# Patient Record
Sex: Female | Born: 1960 | Race: White | Hispanic: No | Marital: Married | Smoking: Never smoker
Health system: Southern US, Community
[De-identification: ages and names within clinical notes are randomized; demographics above are authoritative.]

## PROBLEM LIST (undated history)

## (undated) DIAGNOSIS — I499 Cardiac arrhythmia, unspecified: Secondary | ICD-10-CM

## (undated) HISTORY — PX: ABDOMINAL HYSTERECTOMY: SHX81

## (undated) HISTORY — PX: FRACTURE SURGERY: SHX138

---

## 2006-06-26 ENCOUNTER — Inpatient Hospital Stay: Payer: Self-pay | Admitting: Unknown Physician Specialty

## 2007-03-26 ENCOUNTER — Ambulatory Visit: Payer: Self-pay | Admitting: General Practice

## 2007-04-23 ENCOUNTER — Ambulatory Visit: Payer: Self-pay | Admitting: General Practice

## 2008-07-17 ENCOUNTER — Emergency Department: Payer: Self-pay | Admitting: Emergency Medicine

## 2008-08-24 ENCOUNTER — Ambulatory Visit: Payer: Self-pay | Admitting: Orthopedic Surgery

## 2008-09-14 ENCOUNTER — Encounter: Payer: Self-pay | Admitting: Orthopedic Surgery

## 2008-10-14 ENCOUNTER — Encounter: Payer: Self-pay | Admitting: Orthopedic Surgery

## 2008-11-14 ENCOUNTER — Encounter: Payer: Self-pay | Admitting: Orthopedic Surgery

## 2010-08-31 ENCOUNTER — Emergency Department: Payer: Self-pay | Admitting: Emergency Medicine

## 2011-03-13 ENCOUNTER — Emergency Department: Payer: Self-pay | Admitting: Emergency Medicine

## 2013-07-13 ENCOUNTER — Ambulatory Visit: Payer: Self-pay | Admitting: Physician Assistant

## 2014-01-19 ENCOUNTER — Ambulatory Visit: Payer: Self-pay | Admitting: Physician Assistant

## 2014-03-25 ENCOUNTER — Ambulatory Visit: Payer: Self-pay | Admitting: Gastroenterology

## 2015-09-20 DIAGNOSIS — R7303 Prediabetes: Secondary | ICD-10-CM | POA: Insufficient documentation

## 2015-09-20 DIAGNOSIS — J4520 Mild intermittent asthma, uncomplicated: Secondary | ICD-10-CM | POA: Insufficient documentation

## 2016-08-24 ENCOUNTER — Emergency Department
Admission: EM | Admit: 2016-08-24 | Discharge: 2016-08-24 | Disposition: A | Discharge status: Home / Self Care | Payer: Worker's Compensation | Attending: Emergency Medicine | Admitting: Emergency Medicine

## 2016-08-24 ENCOUNTER — Emergency Department: Payer: Worker's Compensation

## 2016-08-24 ENCOUNTER — Encounter: Payer: Self-pay | Admitting: Urgent Care

## 2016-08-24 DIAGNOSIS — Y9389 Activity, other specified: Secondary | ICD-10-CM | POA: Diagnosis not present

## 2016-08-24 DIAGNOSIS — Y929 Unspecified place or not applicable: Secondary | ICD-10-CM | POA: Insufficient documentation

## 2016-08-24 DIAGNOSIS — Y990 Civilian activity done for income or pay: Secondary | ICD-10-CM | POA: Insufficient documentation

## 2016-08-24 DIAGNOSIS — W1839XA Other fall on same level, initial encounter: Secondary | ICD-10-CM | POA: Diagnosis not present

## 2016-08-24 DIAGNOSIS — S62102A Fracture of unspecified carpal bone, left wrist, initial encounter for closed fracture: Secondary | ICD-10-CM | POA: Insufficient documentation

## 2016-08-24 DIAGNOSIS — S6992XA Unspecified injury of left wrist, hand and finger(s), initial encounter: Secondary | ICD-10-CM | POA: Diagnosis present

## 2016-08-24 DIAGNOSIS — R52 Pain, unspecified: Secondary | ICD-10-CM

## 2016-08-24 HISTORY — DX: Cardiac arrhythmia, unspecified: I49.9

## 2016-08-24 MED ORDER — OXYCODONE-ACETAMINOPHEN 5-325 MG PO TABS
ORAL_TABLET | ORAL | Status: AC
  Administered 2016-08-24: 1 via ORAL
  Filled 2016-08-24: qty 1

## 2016-08-24 MED ORDER — OXYCODONE-ACETAMINOPHEN 5-325 MG PO TABS
1.0000 | ORAL_TABLET | Freq: Once | ORAL | Status: AC
  Administered 2016-08-24: 1 via ORAL

## 2016-08-24 MED ORDER — OXYCODONE-ACETAMINOPHEN 5-325 MG PO TABS: 1.0000 | ORAL_TABLET | ORAL | 0 refills | Status: DC | PRN
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
## 2017-10-20 DIAGNOSIS — H43813 Vitreous degeneration, bilateral: Secondary | ICD-10-CM | POA: Diagnosis not present

## 2017-10-29 DIAGNOSIS — J209 Acute bronchitis, unspecified: Secondary | ICD-10-CM | POA: Diagnosis not present

## 2017-11-03 DIAGNOSIS — J209 Acute bronchitis, unspecified: Secondary | ICD-10-CM | POA: Diagnosis not present

## 2017-11-03 DIAGNOSIS — J019 Acute sinusitis, unspecified: Secondary | ICD-10-CM | POA: Diagnosis not present

## 2018-09-18 DIAGNOSIS — J209 Acute bronchitis, unspecified: Secondary | ICD-10-CM | POA: Diagnosis not present

## 2018-10-23 ENCOUNTER — Other Ambulatory Visit: Payer: Self-pay | Admitting: Physician Assistant

## 2018-10-23 DIAGNOSIS — Z1231 Encounter for screening mammogram for malignant neoplasm of breast: Secondary | ICD-10-CM

## 2019-10-20 ENCOUNTER — Other Ambulatory Visit: Payer: Self-pay

## 2019-10-20 ENCOUNTER — Ambulatory Visit
Admission: RE | Admit: 2019-10-20 | Discharge: 2019-10-20 | Disposition: A | Discharge status: Home / Self Care | Payer: 59 | Source: Ambulatory Visit | Attending: Physician Assistant | Admitting: Physician Assistant

## 2019-10-20 ENCOUNTER — Other Ambulatory Visit: Payer: Self-pay | Admitting: Physician Assistant

## 2019-10-20 ENCOUNTER — Other Ambulatory Visit (HOSPITAL_COMMUNITY): Payer: Self-pay | Admitting: Physician Assistant

## 2019-10-20 DIAGNOSIS — R600 Localized edema: Secondary | ICD-10-CM

## 2019-11-09 ENCOUNTER — Encounter (INDEPENDENT_AMBULATORY_CARE_PROVIDER_SITE_OTHER): Payer: Self-pay | Admitting: Vascular Surgery

## 2019-11-09 ENCOUNTER — Other Ambulatory Visit: Payer: Self-pay

## 2019-11-09 ENCOUNTER — Ambulatory Visit (INDEPENDENT_AMBULATORY_CARE_PROVIDER_SITE_OTHER): Payer: 59 | Admitting: Vascular Surgery

## 2019-11-09 VITALS — BP 149/81 | HR 68 | Resp 14 | Ht 71.0 in | Wt 375.0 lb

## 2019-11-09 DIAGNOSIS — M7989 Other specified soft tissue disorders: Secondary | ICD-10-CM | POA: Diagnosis not present

## 2019-11-09 DIAGNOSIS — M75120 Complete rotator cuff tear or rupture of unspecified shoulder, not specified as traumatic: Secondary | ICD-10-CM | POA: Insufficient documentation

## 2019-11-09 DIAGNOSIS — Z6841 Body Mass Index (BMI) 40.0 and over, adult: Secondary | ICD-10-CM | POA: Diagnosis not present

## 2019-11-09 DIAGNOSIS — E669 Obesity, unspecified: Secondary | ICD-10-CM | POA: Insufficient documentation

## 2019-11-09 DIAGNOSIS — R7303 Prediabetes: Secondary | ICD-10-CM | POA: Diagnosis not present

## 2019-11-09 DIAGNOSIS — T7840XA Allergy, unspecified, initial encounter: Secondary | ICD-10-CM | POA: Insufficient documentation

## 2019-11-09 DIAGNOSIS — G43909 Migraine, unspecified, not intractable, without status migrainosus: Secondary | ICD-10-CM | POA: Insufficient documentation

## 2019-11-09 NOTE — Assessment & Plan Note (Signed)
Weight loss would be of benefit for lower extremity swelling.

## 2019-11-09 NOTE — Progress Notes (Signed)
Patient ID: Joanna Nielsen, female   DOB: Oct 04, 1961, 59 y.o.   MRN: [id number]  Chief Complaint  Patient presents with  . New Patient (Initial Visit)    RLE Edema    HPI Joanna Nielsen is a 59 y.o. female.  I am asked to see the patient by Alvira Philips for evaluation of leg swelling.  For about 3 months, her right leg swelling has gotten progressively worse.  She has been wearing compression stockings, elevating her legs, but the swelling is worsened.  She was ruled out for DVT.  Her left leg has some mild swelling but not as severe.  She denies any ulceration or infection.  The leg is very heavy and difficult to move around.  Her work involves long periods of standing which is difficult.  Her boss has been letting her elevate her legs.  She denies any open wounds or ulcerations.  No fevers or chills.   Past Medical History:  Diagnosis Date  . Irregular heartbeat     Past Surgical History:  Procedure Laterality Date  . ABDOMINAL HYSTERECTOMY    . FRACTURE SURGERY      Family History No bleeding or clotting disorders No aneurysms No autoimmune diseases.    Social History   Tobacco Use  . Smoking status: Never Smoker  . Smokeless tobacco: Never Used  Substance Use Topics  . Alcohol use: No  . Drug use: Not on file  Works at a hotel   No Known Allergies  Current Outpatient Medications  Medication Sig Dispense Refill  . ALPRAZolam (XANAX) 0.5 MG tablet Take 0.5 mg by mouth 2 (two) times daily as needed.    . doxycycline (VIBRAMYCIN) 100 MG capsule Take 100 mg by mouth 2 (two) times daily.    Marland Kitchen oxyCODONE-acetaminophen (ROXICET) 5-325 MG tablet Take 1 tablet by mouth every 4 (four) hours as needed for severe pain. (Patient not taking: Reported on 11/09/2019) 20 tablet 0   No current facility-administered medications for this visit.      REVIEW OF SYSTEMS (Negative unless checked)  Constitutional: [] Weight loss  [] Fever  [] Chills Cardiac: [] Chest pain   [] Chest  pressure   [x] Palpitations   [] Shortness of breath when laying flat   [] Shortness of breath at rest   [x] Shortness of breath with exertion. Vascular:  [] Pain in legs with walking   [] Pain in legs at rest   [] Pain in legs when laying flat   [] Claudication   [] Pain in feet when walking  [] Pain in feet at rest  [] Pain in feet when laying flat   [] History of DVT   [] Phlebitis   [] Swelling in legs   [] Varicose veins   [] Non-healing ulcers Pulmonary:   [] Uses home oxygen   [] Productive cough   [] Hemoptysis   [] Wheeze  [] COPD   [x] Asthma Neurologic:  [] Dizziness  [] Blackouts   [] Seizures   [] History of stroke   [] History of TIA  [] Aphasia   [] Temporary blindness   [] Dysphagia   [] Weakness or numbness in arms   [] Weakness or numbness in legs Musculoskeletal:  [x] Arthritis   [] Joint swelling   [] Joint pain   [] Low back pain Hematologic:  [] Easy bruising  [] Easy bleeding   [] Hypercoagulable state   [] Anemic  [] Hepatitis Gastrointestinal:  [] Blood in stool   [] Vomiting blood  [] Gastroesophageal reflux/heartburn   [] Abdominal pain Genitourinary:  [] Chronic kidney disease   [] Difficult urination  [] Frequent urination  [] Burning with urination   [] Hematuria Skin:  [] Rashes   [] Ulcers   []   Wounds Psychological:  [] History of anxiety   []  History of major depression.    Physical Exam BP (!) 149/81 (BP Location: Right Arm)   Pulse 68   Resp 14   Ht 5\' 11"  (1.803 m)   Wt (!) 375 lb (170.1 kg)   BMI 52.30 kg/m  Gen:  WD/WN, NAD.  Obese Head: Eek/AT, No temporalis wasting. Ear/Nose/Throat: Hearing grossly intact, nares w/o erythema or drainage, oropharynx w/o Erythema/Exudate Eyes: Conjunctiva clear, sclera non-icteric  Neck: trachea midline.  No JVD.  Pulmonary:  Good air movement, respirations not labored, no use of accessory muscles  Cardiac: RRR, no JVD Vascular:  Vessel Right Left  Radial Palpable Palpable                          DP  not palpable  1+  PT  not palpable  trace    Gastrointestinal:. No masses, surgical incisions, or scars. Musculoskeletal: M/S 5/5 throughout.  Extremities without ischemic changes.  No deformity or atrophy.  3+ right lower extremity edema with skin thickening and moderate stasis changes.  1+ left lower extremity pitting edema. Neurologic: Sensation grossly intact in extremities.  Symmetrical.  Speech is fluent. Motor exam as listed above. Psychiatric: Judgment intact, Mood & affect appropriate for pt's clinical situation. Dermatologic: No rashes or ulcers noted.  No cellulitis or open wounds.    Radiology Venous Img Lower Unilateral Right (DVT)  Result Date: 10/20/2019 CLINICAL DATA:  Intermittent right lower extremity pain and edema for the past year. Evaluate for DVT. EXAM: RIGHT LOWER EXTREMITY VENOUS DOPPLER ULTRASOUND TECHNIQUE: Gray-scale sonography with graded compression, as well as color Doppler and duplex ultrasound were performed to evaluate the lower extremity deep venous systems from the level of the common femoral vein and including the common femoral, femoral, profunda femoral, popliteal and calf veins including the posterior tibial, peroneal and gastrocnemius veins when visible. The superficial great saphenous vein was also interrogated. Spectral Doppler was utilized to evaluate flow at rest and with distal augmentation maneuvers in the common femoral, femoral and popliteal veins. COMPARISON:  None. FINDINGS: Examination is degraded due to patient body habitus and poor sonographic window. Contralateral Common Femoral Vein: Respiratory phasicity is normal and symmetric with the symptomatic side. No evidence of thrombus. Normal compressibility. Common Femoral Vein: No evidence of thrombus. Normal compressibility, respiratory phasicity and response to augmentation. Saphenofemoral Junction: No evidence of thrombus. Normal compressibility and flow on color Doppler imaging. Profunda Femoral Vein: No evidence of thrombus. Normal  compressibility and flow on color Doppler imaging. Femoral Vein: No evidence of thrombus. Normal compressibility, respiratory phasicity and response to augmentation. Popliteal Vein: No evidence of thrombus. Normal compressibility, respiratory phasicity and response to augmentation. Calf Veins: Appear patent where imaged. Superficial Great Saphenous Vein: No evidence of thrombus. Normal compressibility. Venous Reflux:  None. Other Findings: There is a moderate amount of subcutaneous edema the level the right calf (images 37 and 38). IMPRESSION: No evidence of DVT within the right lower extremity. Electronically Signed   By: M.D.   On: 10/20/2019 12:36    Labs No results found for this or any previous visit (from the past 2160 hour(s)).  Assessment/Plan:  Prediabetes blood glucose control important in reducing the progression of atherosclerotic disease. Also, involved in wound healing. On appropriate medications.   Swelling of limb I have had a long discussion with the patient regarding swelling and why it  causes symptoms.  Patient will  begin wearing graduated compression stockings class 1 (20-30 mmHg) on a daily basis a prescription was given. The patient will  beginning wearing the stockings first thing in the morning and removing them in the evening. The patient is instructed specifically not to sleep in the stockings.   In addition, behavioral modification will be initiated.  This will include frequent elevation, use of over the counter pain medications and exercise such as walking.  I have reviewed systemic causes for chronic edema such as liver, kidney and cardiac etiologies.  The patient denies problems with these organ systems.    Consideration for a lymph pump will also be made based upon the effectiveness of conservative therapy.  This would help to improve the edema control and prevent sequela such as ulcers and infections   Patient should undergo duplex ultrasound of the  venous system to ensure that DVT or reflux is not present.  The patient will follow-up with me after the ultrasound.    Obesity Weight loss would be of benefit for lower extremity swelling.      Festus Barren 11/09/2019, 11:06 AM   This note was created with Dragon medical transcription system.  Any errors from dictation are unintentional.

## 2019-11-09 NOTE — Assessment & Plan Note (Signed)

## 2019-11-09 NOTE — Assessment & Plan Note (Signed)
blood glucose control important in reducing the progression of atherosclerotic disease. Also, involved in wound healing. On appropriate medications.  

## 2019-11-09 NOTE — Patient Instructions (Signed)
Edema  Edema is when you have too much fluid in your body or under your skin. Edema may make your legs, feet, and ankles swell up. Swelling is also common in looser tissues, like around your eyes. This is a common condition. It gets more common as you get older. There are many possible causes of edema. Eating too much salt (sodium) and being on your feet or sitting for a long time can cause edema in your legs, feet, and ankles. Hot weather may make edema worse. Edema is usually painless. Your skin may look swollen or shiny. Follow these instructions at home:  Keep the swollen body part raised (elevated) above the level of your heart when you are sitting or lying down.  Do not sit still or stand for a long time.  Do not wear tight clothes. Do not wear garters on your upper legs.  Exercise your legs. This can help the swelling go down.  Wear elastic bandages or support stockings as told by your doctor.  Eat a low-salt (low-sodium) diet to reduce fluid as told by your doctor.  Depending on the cause of your swelling, you may need to limit how much fluid you drink (fluid restriction).  Take over-the-counter and prescription medicines only as told by your doctor. Contact a doctor if:  Treatment is not working.  You have heart, liver, or kidney disease and have symptoms of edema.  You have sudden and unexplained weight gain. Get help right away if:  You have shortness of breath or chest pain.  You cannot breathe when you lie down.  You have pain, redness, or warmth in the swollen areas.  You have heart, liver, or kidney disease and get edema all of a sudden.  You have a fever and your symptoms get worse all of a sudden. Summary  Edema is when you have too much fluid in your body or under your skin.  Edema may make your legs, feet, and ankles swell up. Swelling is also common in looser tissues, like around your eyes.  Raise (elevate) the swollen body part above the level of your  heart when you are sitting or lying down.  Follow your doctor's instructions about diet and how much fluid you can drink (fluid restriction). This information is not intended to replace advice given to you by your health care provider. Make sure you discuss any questions you have with your health care provider. Document Revised: 10/03/2017 Document Reviewed: 10/18/2016 Elsevier Patient Education  2020 [employer]  

## 2019-11-24 ENCOUNTER — Ambulatory Visit (INDEPENDENT_AMBULATORY_CARE_PROVIDER_SITE_OTHER): Payer: 59 | Admitting: Nurse Practitioner

## 2019-11-24 ENCOUNTER — Ambulatory Visit (INDEPENDENT_AMBULATORY_CARE_PROVIDER_SITE_OTHER): Payer: 59

## 2019-11-24 ENCOUNTER — Encounter (INDEPENDENT_AMBULATORY_CARE_PROVIDER_SITE_OTHER): Payer: Self-pay | Admitting: Nurse Practitioner

## 2019-11-24 ENCOUNTER — Other Ambulatory Visit: Payer: Self-pay

## 2019-11-24 VITALS — BP 147/88 | HR 66 | Resp 12 | Ht 71.0 in | Wt 373.0 lb

## 2019-11-24 DIAGNOSIS — I890 Lymphedema, not elsewhere classified: Secondary | ICD-10-CM | POA: Diagnosis not present

## 2019-11-24 DIAGNOSIS — M7989 Other specified soft tissue disorders: Secondary | ICD-10-CM | POA: Diagnosis not present

## 2019-11-24 DIAGNOSIS — J4520 Mild intermittent asthma, uncomplicated: Secondary | ICD-10-CM | POA: Diagnosis not present

## 2019-11-29 ENCOUNTER — Encounter (INDEPENDENT_AMBULATORY_CARE_PROVIDER_SITE_OTHER): Payer: Self-pay | Admitting: Nurse Practitioner

## 2019-11-29 NOTE — Progress Notes (Signed)
SUBJECTIVE:  Patient ID: Joanna Nielsen, female    DOB: 1961-01-19, 59 y.o.   MRN: [id number] Chief Complaint  Patient presents with  . Follow-up    U/S follow up    HPI  Joanna Nielsen is a 59 y.o. female that returns today for noninvasive studies related to her lower extremity edema.  The patient has already been contacted by the lymphedema pump company and she has been fitted and should be receiving it soon.  The patient continues to struggle with utilizing medical grade 1 compression stockings due to the swelling.  She does elevate her lower extremities as much as possible and she also exercises as much as possible.  She denies any fever, chills, nausea, vomiting or diarrhea.  She denies any chest pain or shortness of breath.  Today the patient also underwent noninvasive studies which showed no evidence of DVT or superficial venous thrombosis in the right lower extremity.  There is no evidence of venous reflux seen in the right lower extremity.  Past Medical History:  Diagnosis Date  . Irregular heartbeat     Past Surgical History:  Procedure Laterality Date  . ABDOMINAL HYSTERECTOMY    . FRACTURE SURGERY      Social History   Socioeconomic History  . Marital status: Married    Spouse name: Not on file  . Number of children: Not on file  . Years of education: Not on file  . Highest education level: Not on file  Occupational History  . Not on file  Tobacco Use  . Smoking status: Never Smoker  . Smokeless tobacco: Never Used  Substance and Sexual Activity  . Alcohol use: No  . Drug use: Not on file  . Sexual activity: Not on file  Other Topics Concern  . Not on file  Social History Narrative  . Not on file   Social Determinants of Health   Financial Resource Strain:   . Difficulty of Paying Living Expenses: Not on file  Food Insecurity:   . Worried About Programme researcher, broadcasting/film/video in the Last Year: Not on file  . Ran Out of Food in the Last Year: Not on file    Transportation Needs:   . Lack of Transportation (Medical): Not on file  . Lack of Transportation (Non-Medical): Not on file  Physical Activity:   . Days of Exercise per Week: Not on file  . Minutes of Exercise per Session: Not on file  Stress:   . Feeling of Stress : Not on file  Social Connections:   . Frequency of Communication with Friends and Family: Not on file  . Frequency of Social Gatherings with Friends and Family: Not on file  . Attends Religious Services: Not on file  . Active Member of Clubs or Organizations: Not on file  . Attends Banker Meetings: Not on file  . Marital Status: Not on file  Intimate Partner Violence:   . Fear of Current or Ex-Partner: Not on file  . Emotionally Abused: Not on file  . Physically Abused: Not on file  . Sexually Abused: Not on file    History reviewed. No pertinent family history.  No Known Allergies   Review of Systems   Review of Systems: Negative Unless Checked Constitutional: [] Weight loss  [] Fever  [] Chills Cardiac: [] Chest pain   []  Atrial Fibrillation  [] Palpitations   [] Shortness of breath when laying flat   [] Shortness of breath with exertion. [] Shortness of breath at rest Vascular:  []   Pain in legs with walking   [] Pain in legs with standing [] Pain in legs when laying flat   [] Claudication    [] Pain in feet when laying flat    [] History of DVT   [] Phlebitis   [x] Swelling in legs   [] Varicose veins   [] Non-healing ulcers Pulmonary:   [] Uses home oxygen   [] Productive cough   [] Hemoptysis   [] Wheeze  [] COPD   [x] Asthma Neurologic:  [] Dizziness   [] Seizures  [] Blackouts [] History of stroke   [] History of TIA  [] Aphasia   [] Temporary Blindness   [] Weakness or numbness in arm   [] Weakness or numbness in leg Musculoskeletal:   [] Joint swelling   [] Joint pain   [] Low back pain  []  History of Knee Replacement [] Arthritis [] back Surgeries  []  Spinal Stenosis    Hematologic:  [] Easy bruising  [] Easy bleeding    [] Hypercoagulable state   [] Anemic Gastrointestinal:  [] Diarrhea   [] Vomiting  [] Gastroesophageal reflux/heartburn   [] Difficulty swallowing. [] Abdominal pain Genitourinary:  [] Chronic kidney disease   [] Difficult urination  [] Anuric   [] Blood in urine [] Frequent urination  [] Burning with urination   [] Hematuria Skin:  [x] Rashes   [] Ulcers [] Wounds Psychological:  [] History of anxiety   []  History of major depression  []  Memory Difficulties      OBJECTIVE:   Physical Exam  BP (!) 147/88 (BP Location: Right Wrist)   Pulse 66   Resp 12   Ht 5\' 11"  (1.803 m)   Wt (!) 373 lb (169.2 kg)   BMI 52.02 kg/m   Gen: WD/WN, NAD Head: [place]/AT, No temporalis wasting.  Ear/Nose/Throat: Hearing grossly intact, nares w/o erythema or drainage Eyes: PER, EOMI, sclera nonicteric.  Neck: Supple, no masses.  No JVD.  Pulmonary:  Good air movement, no use of accessory muscles.  Cardiac: RRR Vascular:  3+ edema bilaterally Vessel Right Left  Radial Palpable Palpable  Dorsalis Pedis Palpable Palpable  Posterior Tibial Palpable Palpable   Gastrointestinal: soft, non-distended. No guarding/no peritoneal signs.  Musculoskeletal: M/S 5/5 throughout.  No deformity or atrophy.  Neurologic: Pain and light touch intact in extremities.  Symmetrical.  Speech is fluent. Motor exam as listed above. Psychiatric: Judgment intact, Mood & affect appropriate for pt's clinical situation. Dermatologic:  Stasis dermatitis bilaterally no Ulcers Noted.  No changes consistent with cellulitis. Lymph : No Cervical lymphadenopathy, no lichenification or skin changes of chronic lymphedema.       ASSESSMENT AND PLAN:  1. Lymphedema The patient has been fitted for her pump and she should be receiving it soon.  In order to help with edema control until she is able to receive and use the pump we will place the patient in right lower extremity Unna wraps today.  Patient is instructed that these should stay in place for 7 days and  should not be wet.  Patient will present to the office for weekly Unna wrap changes.  We will reevaluate the patient's lower extremity edema in 4 weeks.  She is advised to continue with elevation and exercise as well.  2. Asthma, stable, mild intermittent Continue pulmonary medications and aerosols as already ordered, these medications have been reviewed and there are no changes at this time.     Current Outpatient Medications on File Prior to Visit  Medication Sig Dispense Refill  . ALPRAZolam (XANAX) 0.5 MG tablet Take 0.5 mg by mouth 2 (two) times daily as needed.    . doxycycline (VIBRAMYCIN) 100 MG capsule Take 100 mg by mouth 2 (two) times  daily.    . oxyCODONE-acetaminophen (ROXICET) 5-325 MG tablet Take 1 tablet by mouth every 4 (four) hours as needed for severe pain. (Patient not taking: Reported on 11/09/2019) 20 tablet 0   No current facility-administered medications on file prior to visit.    There are no Patient Instructions on file for this visit. No follow-ups on file.   [name], NP  This note was completed with Office manager.  Any errors are purely unintentional.

## 2019-12-01 ENCOUNTER — Other Ambulatory Visit: Payer: Self-pay

## 2019-12-01 ENCOUNTER — Ambulatory Visit (INDEPENDENT_AMBULATORY_CARE_PROVIDER_SITE_OTHER): Payer: 59 | Admitting: Nurse Practitioner

## 2019-12-01 ENCOUNTER — Encounter (INDEPENDENT_AMBULATORY_CARE_PROVIDER_SITE_OTHER): Payer: Self-pay

## 2019-12-01 VITALS — BP 144/80 | HR 80 | Resp 16 | Wt 368.0 lb

## 2019-12-01 DIAGNOSIS — I890 Lymphedema, not elsewhere classified: Secondary | ICD-10-CM

## 2019-12-01 NOTE — Progress Notes (Signed)
History of Present Illness  There is no documented history at this time  Assessments & Plan   There are no diagnoses linked to this encounter.    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    Procedure:  3 layer unna wrap was placed Right lower extremity.   Plan:   Follow up in one week.   

## 2019-12-06 ENCOUNTER — Other Ambulatory Visit (INDEPENDENT_AMBULATORY_CARE_PROVIDER_SITE_OTHER): Payer: Self-pay | Admitting: Nurse Practitioner

## 2019-12-08 ENCOUNTER — Other Ambulatory Visit: Payer: Self-pay

## 2019-12-08 ENCOUNTER — Telehealth (INDEPENDENT_AMBULATORY_CARE_PROVIDER_SITE_OTHER): Payer: Self-pay

## 2019-12-08 ENCOUNTER — Ambulatory Visit (INDEPENDENT_AMBULATORY_CARE_PROVIDER_SITE_OTHER): Payer: 59 | Admitting: Nurse Practitioner

## 2019-12-08 VITALS — BP 136/83 | HR 73 | Resp 20 | Ht 71.0 in | Wt 370.0 lb

## 2019-12-08 DIAGNOSIS — I890 Lymphedema, not elsewhere classified: Secondary | ICD-10-CM | POA: Diagnosis not present

## 2019-12-08 NOTE — Telephone Encounter (Signed)
I called and made pt aware what NP Vivia Birmingham said in reference to her lymph-edema pumps, " Let me look into it. We can add it but what may need to happen is that the last person that saw her may need to addend their note.  "

## 2019-12-08 NOTE — Progress Notes (Signed)
History of Present Illness  There is no documented history at this time  Assessments & Plan   There are no diagnoses linked to this encounter.    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    Procedure:  3 layer unna wrap was placed Right lower extremity.   Plan:   Follow up in one week.   

## 2019-12-13 ENCOUNTER — Encounter (INDEPENDENT_AMBULATORY_CARE_PROVIDER_SITE_OTHER): Payer: Self-pay | Admitting: Nurse Practitioner

## 2019-12-15 ENCOUNTER — Other Ambulatory Visit: Payer: Self-pay

## 2019-12-15 ENCOUNTER — Ambulatory Visit (INDEPENDENT_AMBULATORY_CARE_PROVIDER_SITE_OTHER): Payer: 59 | Admitting: Nurse Practitioner

## 2019-12-15 VITALS — BP 130/80 | HR 78 | Resp 16 | Ht 71.0 in | Wt 374.0 lb

## 2019-12-15 DIAGNOSIS — I890 Lymphedema, not elsewhere classified: Secondary | ICD-10-CM

## 2019-12-15 NOTE — Progress Notes (Signed)
History of Present Illness  There is no documented history at this time  Assessments & Plan   There are no diagnoses linked to this encounter.    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    Procedure:  3 layer unna wrap was placed Right lower extremity.   Plan:   Follow up in one week.   

## 2019-12-16 ENCOUNTER — Encounter (INDEPENDENT_AMBULATORY_CARE_PROVIDER_SITE_OTHER): Payer: Self-pay | Admitting: Nurse Practitioner

## 2019-12-22 ENCOUNTER — Encounter (INDEPENDENT_AMBULATORY_CARE_PROVIDER_SITE_OTHER): Payer: Self-pay | Admitting: Nurse Practitioner

## 2019-12-22 ENCOUNTER — Other Ambulatory Visit: Payer: Self-pay

## 2019-12-22 ENCOUNTER — Ambulatory Visit (INDEPENDENT_AMBULATORY_CARE_PROVIDER_SITE_OTHER): Payer: 59 | Admitting: Nurse Practitioner

## 2019-12-22 VITALS — BP 135/84 | HR 74 | Ht 71.0 in | Wt 375.0 lb

## 2019-12-22 DIAGNOSIS — I890 Lymphedema, not elsewhere classified: Secondary | ICD-10-CM | POA: Diagnosis not present

## 2019-12-22 DIAGNOSIS — Z6841 Body Mass Index (BMI) 40.0 and over, adult: Secondary | ICD-10-CM

## 2019-12-27 ENCOUNTER — Encounter (INDEPENDENT_AMBULATORY_CARE_PROVIDER_SITE_OTHER): Payer: Self-pay | Admitting: Nurse Practitioner

## 2019-12-27 NOTE — Progress Notes (Signed)
SUBJECTIVE:  Patient ID: Joanna Nielsen, female    DOB: [birth year], 60 y.o.   MRN: [id number] Chief Complaint  Patient presents with  . Follow-up    R unna check    HPI  Joanna Nielsen is a 59 y.o. female presents today for follow-up of her right lower extremity intermittent.  The patient states that she is feeling better with a wrap on her lower extremity.  It has been doing well with helping with her swelling.  However, the swelling is not completely resolved.  She denies no issues with the wrap.  She denies any fever, chills, nausea, vomiting or diarrhea.  Overall the patient is doing well.  Past Medical History:  Diagnosis Date  . Irregular heartbeat     Past Surgical History:  Procedure Laterality Date  . ABDOMINAL HYSTERECTOMY    . FRACTURE SURGERY      Social History   Socioeconomic History  . Marital status: Married    Spouse name: Not on file  . Number of children: Not on file  . Years of education: Not on file  . Highest education level: Not on file  Occupational History  . Not on file  Tobacco Use  . Smoking status: Never Smoker  . Smokeless tobacco: Never Used  Substance and Sexual Activity  . Alcohol use: No  . Drug use: Not on file  . Sexual activity: Not on file  Other Topics Concern  . Not on file  Social History Narrative  . Not on file   Social Determinants of Health   Financial Resource Strain:   . Difficulty of Paying Living Expenses:   Food Insecurity:   . Worried About Programme researcher, broadcasting/film/video in the Last Year:   . Barista in the Last Year:   Transportation Needs:   . Freight forwarder (Medical):   Marland Kitchen Lack of Transportation (Non-Medical):   Physical Activity:   . Days of Exercise per Week:   . Minutes of Exercise per Session:   Stress:   . Feeling of Stress :   Social Connections:   . Frequency of Communication with Friends and Family:   . Frequency of Social Gatherings with Friends and Family:   . Attends Religious  Services:   . Active Member of Clubs or Organizations:   . Attends Banker Meetings:   Marland Kitchen Marital Status:   Intimate Partner Violence:   . Fear of Current or Ex-Partner:   . Emotionally Abused:   Marland Kitchen Physically Abused:   . Sexually Abused:     History reviewed. No pertinent family history.  No Known Allergies   Review of Systems   Review of Systems: Negative Unless Checked Constitutional: [] Weight loss  [] Fever  [] Chills Cardiac: [] Chest pain   []  Atrial Fibrillation  [] Palpitations   [] Shortness of breath when laying flat   [] Shortness of breath with exertion. [] Shortness of breath at rest Vascular:  [] Pain in legs with walking   [] Pain in legs with standing [] Pain in legs when laying flat   [] Claudication    [] Pain in feet when laying flat    [] History of DVT   [] Phlebitis   [x] Swelling in legs   [] Varicose veins   [] Non-healing ulcers Pulmonary:   [] Uses home oxygen   [] Productive cough   [] Hemoptysis   [] Wheeze  [] COPD   [x] Asthma Neurologic:  [] Dizziness   [] Seizures  [] Blackouts [] History of stroke   [] History of TIA  [] Aphasia   [] Temporary  Blindness   [] Weakness or numbness in arm   [] Weakness or numbness in leg Musculoskeletal:   [] Joint swelling   [] Joint pain   [] Low back pain  []  History of Knee Replacement [] Arthritis [] back Surgeries  []  Spinal Stenosis    Hematologic:  [] Easy bruising  [] Easy bleeding   [] Hypercoagulable state   [] Anemic Gastrointestinal:  [] Diarrhea   [] Vomiting  [] Gastroesophageal reflux/heartburn   [] Difficulty swallowing. [] Abdominal pain Genitourinary:  [] Chronic kidney disease   [] Difficult urination  [] Anuric   [] Blood in urine [] Frequent urination  [] Burning with urination   [] Hematuria Skin:  [x] Rashes   [] Ulcers [] Wounds Psychological:  [] History of anxiety   []  History of major depression  []  Memory Difficulties      OBJECTIVE:   Physical Exam  BP 135/84   Pulse 74   Ht 5\' 11"  (1.803 m)   Wt (!) 375 lb (170.1 kg)   BMI 52.30  kg/m   Gen: WD/WN, NAD Head: [place]/AT, No temporalis wasting.  Ear/Nose/Throat: Hearing grossly intact, nares w/o erythema or drainage Eyes: PER, EOMI, sclera nonicteric.  Neck: Supple, no masses.  No JVD.  Pulmonary:  Good air movement, no use of accessory muscles.  Cardiac: RRR Vascular:  2+ edema left lower extremity 3+ on right Vessel Right Left  Radial Palpable Palpable  Brachial Palpable Palpable  Femoral Palpable Palpable  Popliteal Palpable Palpable  Dorsalis Pedis Palpable Palpable  Posterior Tibial Palpable Palpable   Gastrointestinal: soft, non-distended. No guarding/no peritoneal signs.  Musculoskeletal: M/S 5/5 throughout.  No deformity or atrophy.  Neurologic: Pain and light touch intact in extremities.  Symmetrical.  Speech is fluent. Motor exam as listed above. Psychiatric: Judgment intact, Mood & affect appropriate for pt's clinical situation. Dermatologic:  Bilateral stasis dermatitis.  No Ulcers Noted.  No changes consistent with cellulitis. Lymph : No Cervical lymphadenopathy, bilateral dermal thickening       ASSESSMENT AND PLAN:  1. Lymphedema Recommend:  No surgery or intervention at this point in time.    I have reviewed my previous discussion with the patient regarding swelling and why it causes symptoms.  Patient will continue wearing graduated compression stockings class 1 (20-30 mmHg) on a daily basis. The patient will  beginning wearing the stockings first thing in the morning and removing them in the evening. The patient is instructed specifically not to sleep in the stockings.    In addition, behavioral modification including several periods of elevation of the lower extremities during the day will be continued.  This was reviewed with the patient during the initial visit.  The patient will also continue routine exercise, especially walking on a daily basis as was discussed during the initial visit.    Despite conservative treatments including  graduated compression therapy class 1 and behavioral modification including exercise and elevation the patient  has not obtained adequate control of the lymphedema.  The patient still has stage 3 lymphedema and therefore, I believe that a lymph pump should be added to improve the control of the patient's lymphedema.  Additionally, a lymph pump is warranted because it will reduce the risk of cellulitis and ulceration in the future.  Patient should follow-up in six months    2. Class 3 severe obesity with body mass index (BMI) of 50.0 to 59.9 in adult, unspecified obesity type, unspecified whether serious comorbidity present (HCC) Obesity exacerbates the lower extremity edema.  Patient is instructed to exercise daily as part of her conservative therapy.  Patient has been doing more exercise,  continued behavior is encouraged.   Current Outpatient Medications on File Prior to Visit  Medication Sig Dispense Refill  . ALPRAZolam (XANAX) 0.5 MG tablet Take 0.5 mg by mouth 2 (two) times daily as needed.    . doxycycline (VIBRAMYCIN) 100 MG capsule Take 100 mg by mouth 2 (two) times daily.    Marland Kitchen oxyCODONE-acetaminophen (ROXICET) 5-325 MG tablet Take 1 tablet by mouth every 4 (four) hours as needed for severe pain. (Patient not taking: Reported on 12/01/2019) 20 tablet 0   No current facility-administered medications on file prior to visit.    There are no Patient Instructions on file for this visit. No follow-ups on file.   Kris Hartmann, NP  This note was completed with Sales executive.  Any errors are purely unintentional.

## 2019-12-29 ENCOUNTER — Other Ambulatory Visit: Payer: Self-pay

## 2019-12-29 ENCOUNTER — Ambulatory Visit (INDEPENDENT_AMBULATORY_CARE_PROVIDER_SITE_OTHER): Payer: 59 | Admitting: Nurse Practitioner

## 2019-12-29 VITALS — BP 150/86 | HR 76 | Resp 17 | Ht 71.0 in | Wt 374.0 lb

## 2019-12-29 DIAGNOSIS — I890 Lymphedema, not elsewhere classified: Secondary | ICD-10-CM | POA: Diagnosis not present

## 2019-12-29 NOTE — Progress Notes (Signed)
History of Present Illness  There is no documented history at this time  Assessments & Plan   There are no diagnoses linked to this encounter.    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    Procedure:  3 layer unna wrap was placed Right lower extremity.   Plan:   Follow up in one week.   

## 2019-12-31 ENCOUNTER — Encounter (INDEPENDENT_AMBULATORY_CARE_PROVIDER_SITE_OTHER): Payer: Self-pay | Admitting: Nurse Practitioner

## 2020-01-05 ENCOUNTER — Other Ambulatory Visit: Payer: Self-pay

## 2020-01-05 ENCOUNTER — Encounter (INDEPENDENT_AMBULATORY_CARE_PROVIDER_SITE_OTHER): Payer: Self-pay

## 2020-01-05 ENCOUNTER — Ambulatory Visit (INDEPENDENT_AMBULATORY_CARE_PROVIDER_SITE_OTHER): Payer: 59 | Admitting: Nurse Practitioner

## 2020-01-05 VITALS — BP 128/80 | HR 79 | Resp 16 | Wt 371.6 lb

## 2020-01-05 DIAGNOSIS — I890 Lymphedema, not elsewhere classified: Secondary | ICD-10-CM

## 2020-01-05 NOTE — Progress Notes (Signed)
History of Present Illness  There is no documented history at this time  Assessments & Plan   There are no diagnoses linked to this encounter.    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    Procedure:  3 layer unna wrap was placed Right lower extremity.   Plan:   Follow up in one week.   

## 2020-01-12 ENCOUNTER — Ambulatory Visit (INDEPENDENT_AMBULATORY_CARE_PROVIDER_SITE_OTHER): Payer: 59 | Admitting: Nurse Practitioner

## 2020-01-12 ENCOUNTER — Encounter (INDEPENDENT_AMBULATORY_CARE_PROVIDER_SITE_OTHER): Payer: Self-pay | Admitting: Nurse Practitioner

## 2020-01-12 ENCOUNTER — Other Ambulatory Visit: Payer: Self-pay

## 2020-01-12 VITALS — BP 149/84 | HR 65 | Resp 16 | Wt 370.0 lb

## 2020-01-12 DIAGNOSIS — I890 Lymphedema, not elsewhere classified: Secondary | ICD-10-CM

## 2020-01-12 NOTE — Progress Notes (Signed)
History of Present Illness  There is no documented history at this time  Assessments & Plan   There are no diagnoses linked to this encounter.    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    Procedure:  3 layer unna wrap was placed Right lower extremity.   Plan:   Follow up in one week.   

## 2020-01-19 ENCOUNTER — Encounter (INDEPENDENT_AMBULATORY_CARE_PROVIDER_SITE_OTHER): Payer: Self-pay | Admitting: Nurse Practitioner

## 2020-01-19 ENCOUNTER — Other Ambulatory Visit: Payer: Self-pay

## 2020-01-19 ENCOUNTER — Ambulatory Visit (INDEPENDENT_AMBULATORY_CARE_PROVIDER_SITE_OTHER): Payer: 59 | Admitting: Nurse Practitioner

## 2020-01-19 VITALS — BP 153/83 | HR 80 | Ht 71.0 in | Wt 371.0 lb

## 2020-01-19 DIAGNOSIS — I890 Lymphedema, not elsewhere classified: Secondary | ICD-10-CM

## 2020-01-19 DIAGNOSIS — J4520 Mild intermittent asthma, uncomplicated: Secondary | ICD-10-CM | POA: Diagnosis not present

## 2020-01-19 NOTE — Progress Notes (Signed)
Subjective:    Patient ID: Joanna Nielsen, female    DOB: 03/04/1961, 59 y.o.   MRN: [id number] Chief Complaint  Patient presents with  . Follow-up    Unna boot    The patient returns to the office for followup evaluation regarding leg swelling.  The patient was in [place] wraps with the right lower extremity to help with swelling.  Today the swelling is greatly improved.  The swelling has persisted but with the lymph pump is much, much better controlled. The pain associated with swelling is essentially eliminated. There have not been any interval development of a ulcerations or wounds.  The patient denies problems with the pump, noting it is working well and the leggings are in good condition.  Since the previous visit the patient has been using the lymph pump on a routine basis and  has noted significant improvement in the lymphedema.   Patient stated the lymph pump has been a very positive factor in her care.    Review of Systems  Cardiovascular: Positive for leg swelling.  All other systems reviewed and are negative.      Objective:   Physical Exam Vitals reviewed.  HENT:     Head: Normocephalic.  Cardiovascular:     Rate and Rhythm: Normal rate and regular rhythm.     Pulses: Normal pulses.  Musculoskeletal:     Right lower leg: 1+ Edema present.  Neurological:     Mental Status: She is alert and oriented to person, place, and time.  Psychiatric:        Mood and Affect: Mood normal.        Behavior: Behavior normal.        Thought Content: Thought content normal.        Judgment: Judgment normal.     BP (!) 153/83   Pulse 80   Ht 5\' 11"  (1.803 m)   Wt (!) 371 lb (168.3 kg)   BMI 51.74 kg/m   Past Medical History:  Diagnosis Date  . Irregular heartbeat     Social History   Socioeconomic History  . Marital status: Married    Spouse name: Not on file  . Number of children: Not on file  . Years of education: Not on file  . Highest education level: Not  on file  Occupational History  . Not on file  Tobacco Use  . Smoking status: Never Smoker  . Smokeless tobacco: Never Used  Substance and Sexual Activity  . Alcohol use: No  . Drug use: Not on file  . Sexual activity: Not on file  Other Topics Concern  . Not on file  Social History Narrative  . Not on file   Social Determinants of Health   Financial Resource Strain:   . Difficulty of Paying Living Expenses:   Food Insecurity:   . Worried About Charity fundraiser in the Last Year:   . Arboriculturist in the Last Year:   Transportation Needs:   . Film/video editor (Medical):   Marland Kitchen Lack of Transportation (Non-Medical):   Physical Activity:   . Days of Exercise per Week:   . Minutes of Exercise per Session:   Stress:   . Feeling of Stress :   Social Connections:   . Frequency of Communication with Friends and Family:   . Frequency of Social Gatherings with Friends and Family:   . Attends Religious Services:   . Active Member of Clubs or Organizations:   .  Attends Banker Meetings:   Marland Kitchen Marital Status:   Intimate Partner Violence:   . Fear of Current or Ex-Partner:   . Emotionally Abused:   Marland Kitchen Physically Abused:   . Sexually Abused:     Past Surgical History:  Procedure Laterality Date  . ABDOMINAL HYSTERECTOMY    . FRACTURE SURGERY      History reviewed. No pertinent family history.  No Known Allergies     Assessment & Plan:   1. Lymphedema  No surgery or intervention at this point in time.    I have reviewed my discussion with the patient regarding lymphedema and why it  causes symptoms.  Patient will continue wearing graduated compression stockings class 1 (20-30 mmHg) on a daily basis a prescription was given. The patient is reminded to put the stockings on first thing in the morning and removing them in the evening. The patient is instructed specifically not to sleep in the stockings.   In addition, behavioral modification throughout the  day will be continued.  This will include frequent elevation (such as in a recliner), use of over the counter pain medications as needed and exercise such as walking.  I have reviewed systemic causes for chronic edema such as liver, kidney and cardiac etiologies and there does not appear to be any significant changes in these organ systems over the past year.  The patient is under the impression that these organ systems are all stable and unchanged.    The patient will continue aggressive use of the  lymph pump.  This will continue to improve the edema control and prevent sequela such as ulcers and infections.   Since the patient was recently removed from Unna wraps we will have the patient follow-up in 3 months to evaluate progression with lymphedema pump.   2. Asthma, stable, mild intermittent Continue pulmonary medications and aerosols as already ordered, these medications have been reviewed and there are no changes at this time.     Current Outpatient Medications on File Prior to Visit  Medication Sig Dispense Refill  . ALPRAZolam (XANAX) 0.5 MG tablet Take 0.5 mg by mouth 2 (two) times daily as needed.    . doxycycline (VIBRAMYCIN) 100 MG capsule Take 100 mg by mouth 2 (two) times daily.    Marland Kitchen oxyCODONE-acetaminophen (ROXICET) 5-325 MG tablet Take 1 tablet by mouth every 4 (four) hours as needed for severe pain. (Patient not taking: Reported on 01/12/2020) 20 tablet 0   No current facility-administered medications on file prior to visit.    There are no Patient Instructions on file for this visit. No follow-ups on file.   [name], NP

## 2020-04-19 ENCOUNTER — Ambulatory Visit (INDEPENDENT_AMBULATORY_CARE_PROVIDER_SITE_OTHER): Payer: 59 | Admitting: Nurse Practitioner

## 2020-04-19 ENCOUNTER — Other Ambulatory Visit: Payer: Self-pay

## 2020-04-19 ENCOUNTER — Encounter (INDEPENDENT_AMBULATORY_CARE_PROVIDER_SITE_OTHER): Payer: Self-pay | Admitting: Nurse Practitioner

## 2020-04-19 VITALS — BP 136/80 | HR 84 | Resp 16 | Wt 372.0 lb

## 2020-04-19 DIAGNOSIS — I890 Lymphedema, not elsewhere classified: Secondary | ICD-10-CM | POA: Diagnosis not present

## 2020-04-19 NOTE — Progress Notes (Signed)
Subjective:    Patient ID: Joanna Nielsen, female    DOB: 03/12/1961, 59 y.o.   MRN: [id number] Chief Complaint  Patient presents with  . Follow-up    17month follow up    The patient returns to the office for followup evaluation regarding leg swelling.  The swelling has persisted but with the lymph pump is much, much better controlled. The pain associated with swelling is essentially eliminated. There have not been any interval development of a ulcerations or wounds.  The patient denies problems with the pump, noting it is working well and the leggings are in good condition.  Since the previous visit the patient has been wearing graduated compression stockings and using the lymph pump on a routine basis and  has noted significant improvement in the lymphedema.   Patient stated the lymph pump has been a very positive factor in her care.    Review of Systems  Cardiovascular: Positive for leg swelling.       Objective:   Physical Exam Vitals reviewed.  HENT:     Head: Normocephalic.  Cardiovascular:     Rate and Rhythm: Normal rate and regular rhythm.     Pulses: Normal pulses.     Heart sounds: Normal heart sounds.  Pulmonary:     Effort: Pulmonary effort is normal.     Breath sounds: Normal breath sounds.  Musculoskeletal:     Right lower leg: 1+ Edema present.     Left lower leg: 1+ Edema present.  Skin:    General: Skin is warm and dry.  Neurological:     Mental Status: She is alert and oriented to person, place, and time.  Psychiatric:        Mood and Affect: Mood normal.        Behavior: Behavior normal.        Thought Content: Thought content normal.        Judgment: Judgment normal.     BP 136/80 (BP Location: Right Arm)   Pulse 84   Resp 16   Wt (!) 372 lb (168.7 kg)   BMI 51.88 kg/m   Past Medical History:  Diagnosis Date  . Irregular heartbeat     Social History   Socioeconomic History  . Marital status: Married    Spouse name: Not on file    . Number of children: Not on file  . Years of education: Not on file  . Highest education level: Not on file  Occupational History  . Not on file  Tobacco Use  . Smoking status: Never Smoker  . Smokeless tobacco: Never Used  Substance and Sexual Activity  . Alcohol use: No  . Drug use: Never  . Sexual activity: Not on file  Other Topics Concern  . Not on file  Social History Narrative  . Not on file   Social Determinants of Health   Financial Resource Strain:   . Difficulty of Paying Living Expenses:   Food Insecurity:   . Worried About Programme researcher, broadcasting/film/video in the Last Year:   . Barista in the Last Year:   Transportation Needs:   . Freight forwarder (Medical):   Marland Kitchen Lack of Transportation (Non-Medical):   Physical Activity:   . Days of Exercise per Week:   . Minutes of Exercise per Session:   Stress:   . Feeling of Stress :   Social Connections:   . Frequency of Communication with Friends and Family:   .  Frequency of Social Gatherings with Friends and Family:   . Attends Religious Services:   . Active Member of Clubs or Organizations:   . Attends Banker Meetings:   Marland Kitchen Marital Status:   Intimate Partner Violence:   . Fear of Current or Ex-Partner:   . Emotionally Abused:   Marland Kitchen Physically Abused:   . Sexually Abused:     Past Surgical History:  Procedure Laterality Date  . ABDOMINAL HYSTERECTOMY    . FRACTURE SURGERY      Family History  Problem Relation Age of Onset  . Pancreatic cancer Father   . Diabetes Sister   . Anxiety disorder Brother   . Diabetes Maternal Grandfather     No Known Allergies     Assessment & Plan:   1. Lymphedema  No surgery or intervention at this point in time.    I have reviewed my discussion with the patient regarding lymphedema and why it  causes symptoms.  Patient will continue wearing graduated compression stockings class 1 (20-30 mmHg) on a daily basis a prescription was given. The patient is  reminded to put the stockings on first thing in the morning and removing them in the evening. The patient is instructed specifically not to sleep in the stockings.   In addition, behavioral modification throughout the day will be continued.  This will include frequent elevation (such as in a recliner), use of over the counter pain medications as needed and exercise such as walking.  I have reviewed systemic causes for chronic edema such as liver, kidney and cardiac etiologies and there does not appear to be any significant changes in these organ systems over the past year.  The patient is under the impression that these organ systems are all stable and unchanged.    The patient will continue aggressive use of the  lymph pump.  This will continue to improve the edema control and prevent sequela such as ulcers and infections.   The patient will follow-up with me on an annual basis.     Current Outpatient Medications on File Prior to Visit  Medication Sig Dispense Refill  . ALPRAZolam (XANAX) 0.5 MG tablet Take 0.5 mg by mouth 2 (two) times daily as needed.    . doxycycline (VIBRAMYCIN) 100 MG capsule Take 100 mg by mouth 2 (two) times daily. (Patient not taking: Reported on 04/19/2020)    . oxyCODONE-acetaminophen (ROXICET) 5-325 MG tablet Take 1 tablet by mouth every 4 (four) hours as needed for severe pain. (Patient not taking: Reported on 01/12/2020) 20 tablet 0   No current facility-administered medications on file prior to visit.    There are no Patient Instructions on file for this visit. No follow-ups on file.   [name], NP

## 2020-09-15 DIAGNOSIS — R262 Difficulty in walking, not elsewhere classified: Secondary | ICD-10-CM | POA: Diagnosis not present

## 2020-09-15 DIAGNOSIS — M25561 Pain in right knee: Secondary | ICD-10-CM | POA: Diagnosis not present

## 2020-09-15 DIAGNOSIS — M25562 Pain in left knee: Secondary | ICD-10-CM | POA: Diagnosis not present

## 2020-09-15 DIAGNOSIS — M6281 Muscle weakness (generalized): Secondary | ICD-10-CM | POA: Diagnosis not present

## 2020-09-19 DIAGNOSIS — M25561 Pain in right knee: Secondary | ICD-10-CM | POA: Diagnosis not present

## 2020-09-19 DIAGNOSIS — M6281 Muscle weakness (generalized): Secondary | ICD-10-CM | POA: Diagnosis not present

## 2020-09-19 DIAGNOSIS — R262 Difficulty in walking, not elsewhere classified: Secondary | ICD-10-CM | POA: Diagnosis not present

## 2020-09-19 DIAGNOSIS — M25562 Pain in left knee: Secondary | ICD-10-CM | POA: Diagnosis not present

## 2020-09-22 DIAGNOSIS — R262 Difficulty in walking, not elsewhere classified: Secondary | ICD-10-CM | POA: Diagnosis not present

## 2020-09-22 DIAGNOSIS — M25561 Pain in right knee: Secondary | ICD-10-CM | POA: Diagnosis not present

## 2020-09-22 DIAGNOSIS — M25562 Pain in left knee: Secondary | ICD-10-CM | POA: Diagnosis not present

## 2020-09-22 DIAGNOSIS — M6281 Muscle weakness (generalized): Secondary | ICD-10-CM | POA: Diagnosis not present

## 2020-09-27 DIAGNOSIS — M25561 Pain in right knee: Secondary | ICD-10-CM | POA: Diagnosis not present

## 2020-09-27 DIAGNOSIS — R262 Difficulty in walking, not elsewhere classified: Secondary | ICD-10-CM | POA: Diagnosis not present

## 2020-09-27 DIAGNOSIS — M6281 Muscle weakness (generalized): Secondary | ICD-10-CM | POA: Diagnosis not present

## 2020-09-27 DIAGNOSIS — M25562 Pain in left knee: Secondary | ICD-10-CM | POA: Diagnosis not present

## 2020-10-03 DIAGNOSIS — M25561 Pain in right knee: Secondary | ICD-10-CM | POA: Diagnosis not present

## 2020-10-03 DIAGNOSIS — M25562 Pain in left knee: Secondary | ICD-10-CM | POA: Diagnosis not present

## 2020-10-03 DIAGNOSIS — R262 Difficulty in walking, not elsewhere classified: Secondary | ICD-10-CM | POA: Diagnosis not present

## 2020-10-03 DIAGNOSIS — M6281 Muscle weakness (generalized): Secondary | ICD-10-CM | POA: Diagnosis not present

## 2020-10-05 DIAGNOSIS — M25561 Pain in right knee: Secondary | ICD-10-CM | POA: Diagnosis not present

## 2020-10-05 DIAGNOSIS — R262 Difficulty in walking, not elsewhere classified: Secondary | ICD-10-CM | POA: Diagnosis not present

## 2020-10-05 DIAGNOSIS — M25562 Pain in left knee: Secondary | ICD-10-CM | POA: Diagnosis not present

## 2020-10-05 DIAGNOSIS — M6281 Muscle weakness (generalized): Secondary | ICD-10-CM | POA: Diagnosis not present

## 2020-11-22 DIAGNOSIS — Z20822 Contact with and (suspected) exposure to covid-19: Secondary | ICD-10-CM | POA: Diagnosis not present

## 2020-11-28 DIAGNOSIS — Z20822 Contact with and (suspected) exposure to covid-19: Secondary | ICD-10-CM | POA: Diagnosis not present

## 2020-11-28 DIAGNOSIS — Z03818 Encounter for observation for suspected exposure to other biological agents ruled out: Secondary | ICD-10-CM | POA: Diagnosis not present

## 2021-01-09 DIAGNOSIS — R002 Palpitations: Secondary | ICD-10-CM | POA: Diagnosis not present

## 2021-01-09 DIAGNOSIS — R040 Epistaxis: Secondary | ICD-10-CM | POA: Diagnosis not present

## 2021-01-15 DIAGNOSIS — J0110 Acute frontal sinusitis, unspecified: Secondary | ICD-10-CM | POA: Diagnosis not present

## 2021-01-15 DIAGNOSIS — R509 Fever, unspecified: Secondary | ICD-10-CM | POA: Diagnosis not present

## 2021-01-15 DIAGNOSIS — R040 Epistaxis: Secondary | ICD-10-CM | POA: Diagnosis not present

## 2021-02-23 ENCOUNTER — Other Ambulatory Visit: Payer: Self-pay

## 2021-02-23 ENCOUNTER — Telehealth (INDEPENDENT_AMBULATORY_CARE_PROVIDER_SITE_OTHER): Payer: Self-pay | Admitting: Vascular Surgery

## 2021-02-23 ENCOUNTER — Ambulatory Visit (INDEPENDENT_AMBULATORY_CARE_PROVIDER_SITE_OTHER): Payer: BC Managed Care – PPO | Admitting: Nurse Practitioner

## 2021-02-23 VITALS — BP 138/84 | HR 78 | Ht 70.0 in | Wt 347.0 lb

## 2021-02-23 DIAGNOSIS — I890 Lymphedema, not elsewhere classified: Secondary | ICD-10-CM

## 2021-02-23 NOTE — Telephone Encounter (Signed)
Called stating that her right leg is swollen above ankle has a sore and she's having some drainage. Patient states the drainage has stopped temporarily (she wrapped leg with ace bandage). Patient would like to come in to be seen today if possible. Patient was last seen 04/2020 62mo f/u with FB. Please advise.

## 2021-02-23 NOTE — Telephone Encounter (Signed)
Called and scheduled patient

## 2021-02-23 NOTE — Progress Notes (Signed)
History of Present Illness  There is no documented history at this time  Assessments & Plan   There are no diagnoses linked to this encounter.    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    Procedure:  3 layer unna wrap was placed Right lower extremity.   Plan:   Follow up in one week.   

## 2021-02-23 NOTE — Telephone Encounter (Signed)
She can come in for Unna wraps w/ f/u in 4 weeks

## 2021-02-24 ENCOUNTER — Encounter (INDEPENDENT_AMBULATORY_CARE_PROVIDER_SITE_OTHER): Payer: Self-pay | Admitting: Nurse Practitioner

## 2021-03-02 ENCOUNTER — Ambulatory Visit (INDEPENDENT_AMBULATORY_CARE_PROVIDER_SITE_OTHER): Payer: BC Managed Care – PPO | Admitting: Nurse Practitioner

## 2021-03-02 ENCOUNTER — Other Ambulatory Visit: Payer: Self-pay

## 2021-03-02 VITALS — BP 177/158 | HR 49 | Ht 70.0 in | Wt 362.0 lb

## 2021-03-02 DIAGNOSIS — I890 Lymphedema, not elsewhere classified: Secondary | ICD-10-CM | POA: Diagnosis not present

## 2021-03-02 NOTE — Progress Notes (Signed)
History of Present Illness  There is no documented history at this time  Assessments & Plan   There are no diagnoses linked to this encounter.    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    Procedure:  3 layer unna wrap was placed Right lower extremity.   Plan:   Follow up in one week.   

## 2021-03-03 ENCOUNTER — Encounter (INDEPENDENT_AMBULATORY_CARE_PROVIDER_SITE_OTHER): Payer: Self-pay | Admitting: Nurse Practitioner

## 2021-03-09 ENCOUNTER — Ambulatory Visit (INDEPENDENT_AMBULATORY_CARE_PROVIDER_SITE_OTHER): Payer: BC Managed Care – PPO | Admitting: Nurse Practitioner

## 2021-03-09 ENCOUNTER — Other Ambulatory Visit: Payer: Self-pay

## 2021-03-09 VITALS — BP 152/86 | HR 73 | Ht 70.0 in | Wt 362.0 lb

## 2021-03-09 DIAGNOSIS — I890 Lymphedema, not elsewhere classified: Secondary | ICD-10-CM | POA: Diagnosis not present

## 2021-03-09 NOTE — Progress Notes (Signed)
History of Present Illness  There is no documented history at this time  Assessments & Plan   There are no diagnoses linked to this encounter.    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    Procedure:  3 layer unna wrap was placed Right lower extremity.   Plan:   Follow up in one week.   

## 2021-03-11 ENCOUNTER — Encounter (INDEPENDENT_AMBULATORY_CARE_PROVIDER_SITE_OTHER): Payer: Self-pay | Admitting: Nurse Practitioner

## 2021-03-16 ENCOUNTER — Other Ambulatory Visit: Payer: Self-pay

## 2021-03-16 ENCOUNTER — Ambulatory Visit (INDEPENDENT_AMBULATORY_CARE_PROVIDER_SITE_OTHER): Payer: BC Managed Care – PPO | Admitting: Vascular Surgery

## 2021-03-16 ENCOUNTER — Encounter (INDEPENDENT_AMBULATORY_CARE_PROVIDER_SITE_OTHER): Payer: BC Managed Care – PPO

## 2021-03-16 ENCOUNTER — Encounter (INDEPENDENT_AMBULATORY_CARE_PROVIDER_SITE_OTHER): Payer: Self-pay

## 2021-03-16 VITALS — BP 138/80 | HR 65 | Resp 16 | Wt 366.0 lb

## 2021-03-16 DIAGNOSIS — M7989 Other specified soft tissue disorders: Secondary | ICD-10-CM

## 2021-03-16 DIAGNOSIS — R600 Localized edema: Secondary | ICD-10-CM | POA: Diagnosis not present

## 2021-03-16 NOTE — Progress Notes (Signed)
History of Present Illness  Swelling and ulcer RLE  Assessments & Plan   There are no diagnoses linked to this encounter.    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    Procedure:  3 layer unna wrap was placed Right lower extremity.   Plan:   Follow up in one week.

## 2021-03-23 ENCOUNTER — Ambulatory Visit (INDEPENDENT_AMBULATORY_CARE_PROVIDER_SITE_OTHER): Payer: BC Managed Care – PPO | Admitting: Nurse Practitioner

## 2021-03-23 ENCOUNTER — Other Ambulatory Visit: Payer: Self-pay

## 2021-03-23 ENCOUNTER — Encounter (INDEPENDENT_AMBULATORY_CARE_PROVIDER_SITE_OTHER): Payer: Self-pay | Admitting: Nurse Practitioner

## 2021-03-23 VITALS — BP 142/80 | HR 75 | Resp 16 | Wt 360.8 lb

## 2021-03-23 DIAGNOSIS — J4520 Mild intermittent asthma, uncomplicated: Secondary | ICD-10-CM | POA: Diagnosis not present

## 2021-03-23 DIAGNOSIS — I890 Lymphedema, not elsewhere classified: Secondary | ICD-10-CM

## 2021-03-25 ENCOUNTER — Encounter (INDEPENDENT_AMBULATORY_CARE_PROVIDER_SITE_OTHER): Payer: Self-pay | Admitting: Nurse Practitioner

## 2021-03-25 NOTE — Progress Notes (Signed)
Subjective:    Patient ID: Joanna Nielsen, female    DOB: Nov 19, 1960, 60 y.o.   MRN: [id number] Chief Complaint  Patient presents with   Follow-up    4wk right unna boot check    Joanna Nielsen is a 60 year old female that returns today for follow-up after being in Unna wraps for 4 weeks.  The swelling has improved quite a bit and the pain associated with swelling has decreased substantially.  The patient previously had weeping of her lower extremities.  There have not been any interval development of a ulcerations or wounds.  The patient notes that prior to this incident she had not been wearing her compression socks regularly or utilizing her compression pump regularly.  The patient also states elevation during the day and exercise is being done too.        Review of Systems  Cardiovascular:  Positive for leg swelling.  All other systems reviewed and are negative.     Objective:   Physical Exam Vitals reviewed.  HENT:     Head: Normocephalic.  Cardiovascular:     Rate and Rhythm: Normal rate.     Pulses: Normal pulses.  Pulmonary:     Effort: Pulmonary effort is normal.  Musculoskeletal:     Right lower leg: Edema present.     Left lower leg: Edema present.  Neurological:     Mental Status: She is alert and oriented to person, place, and time.  Psychiatric:        Mood and Affect: Mood normal.        Behavior: Behavior normal.        Thought Content: Thought content normal.        Judgment: Judgment normal.    BP (!) 142/80 (BP Location: Right Arm)   Pulse 75   Resp 16   Wt (!) 360 lb 12.8 oz (163.7 kg)   BMI 51.77 kg/m   Past Medical History:  Diagnosis Date   Irregular heartbeat     Social History   Socioeconomic History   Marital status: Married    Spouse name: Not on file   Number of children: Not on file   Years of education: Not on file   Highest education level: Not on file  Occupational History   Not on file  Tobacco Use   Smoking  status: Never   Smokeless tobacco: Never  Substance and Sexual Activity   Alcohol use: No   Drug use: Never   Sexual activity: Not on file  Other Topics Concern   Not on file  Social History Narrative   Not on file   Social Determinants of Health   Financial Resource Strain: Not on file  Food Insecurity: Not on file  Transportation Needs: Not on file  Physical Activity: Not on file  Stress: Not on file  Social Connections: Not on file  Intimate Partner Violence: Not on file    Past Surgical History:  Procedure Laterality Date   ABDOMINAL HYSTERECTOMY     FRACTURE SURGERY      Family History  Problem Relation Age of Onset   Pancreatic cancer Father    Diabetes Sister    Anxiety disorder Brother    Diabetes Maternal Grandfather     No Known Allergies  No flowsheet data found.    CMP  No results found for: NA, K, CL, CO2, GLUCOSE, BUN, CREATININE, CALCIUM, PROT, ALBUMIN, AST, ALT, ALKPHOS, BILITOT, GFRNONAA, GFRAA   No results found.  Assessment & Plan:   1. Lymphedema  No surgery or intervention at this point in time.    I have reviewed my discussion with the patient regarding lymphedema and why it  causes symptoms.  Patient will continue wearing graduated compression stockings class 1 (20-30 mmHg) on a daily basis a prescription was given. The patient is reminded to put the stockings on first thing in the morning and removing them in the evening. The patient is instructed specifically not to sleep in the stockings.   In addition, behavioral modification throughout the day will be continued.  This will include frequent elevation (such as in a recliner), use of over the counter pain medications as needed and exercise such as walking.  I have reviewed systemic causes for chronic edema such as liver, kidney and cardiac etiologies and there does not appear to be any significant changes in these organ systems over the past year.  The patient is under the  impression that these organ systems are all stable and unchanged.    The patient will continue aggressive use of the  lymph pump.  This will continue to improve the edema control and prevent sequela such as ulcers and infections.   The patient will follow-up with me in 6 months  2. Asthma, stable, mild intermittent Continue pulmonary medications and aerosols as already ordered, these medications have been reviewed and there are no changes at this time.     Current Outpatient Medications on File Prior to Visit  Medication Sig Dispense Refill   ALPRAZolam (XANAX) 0.5 MG tablet Take 0.5 mg by mouth 2 (two) times daily as needed.     doxycycline (VIBRAMYCIN) 100 MG capsule Take 100 mg by mouth 2 (two) times daily. (Patient not taking: No sig reported)     oxyCODONE-acetaminophen (ROXICET) 5-325 MG tablet Take 1 tablet by mouth every 4 (four) hours as needed for severe pain. (Patient not taking: No sig reported) 20 tablet 0   No current facility-administered medications on file prior to visit.    There are no Patient Instructions on file for this visit. No follow-ups on file.   [name], NP

## 2021-04-17 ENCOUNTER — Ambulatory Visit (INDEPENDENT_AMBULATORY_CARE_PROVIDER_SITE_OTHER): Payer: 59 | Admitting: Vascular Surgery

## 2021-04-20 ENCOUNTER — Ambulatory Visit (INDEPENDENT_AMBULATORY_CARE_PROVIDER_SITE_OTHER): Payer: BC Managed Care – PPO | Admitting: Vascular Surgery

## 2021-07-01 ENCOUNTER — Emergency Department: Payer: Worker's Compensation

## 2021-07-01 ENCOUNTER — Encounter: Payer: Self-pay | Admitting: Emergency Medicine

## 2021-07-01 ENCOUNTER — Other Ambulatory Visit: Payer: Self-pay

## 2021-07-01 ENCOUNTER — Emergency Department
Admission: EM | Admit: 2021-07-01 | Discharge: 2021-07-01 | Disposition: A | Discharge status: Home / Self Care | Payer: Worker's Compensation | Attending: Emergency Medicine | Admitting: Emergency Medicine

## 2021-07-01 DIAGNOSIS — Z23 Encounter for immunization: Secondary | ICD-10-CM | POA: Diagnosis not present

## 2021-07-01 DIAGNOSIS — S0990XA Unspecified injury of head, initial encounter: Secondary | ICD-10-CM

## 2021-07-01 DIAGNOSIS — W19XXXA Unspecified fall, initial encounter: Secondary | ICD-10-CM

## 2021-07-01 DIAGNOSIS — S8002XA Contusion of left knee, initial encounter: Secondary | ICD-10-CM | POA: Diagnosis not present

## 2021-07-01 DIAGNOSIS — S0101XA Laceration without foreign body of scalp, initial encounter: Secondary | ICD-10-CM | POA: Diagnosis not present

## 2021-07-01 DIAGNOSIS — J4520 Mild intermittent asthma, uncomplicated: Secondary | ICD-10-CM | POA: Insufficient documentation

## 2021-07-01 DIAGNOSIS — W01198A Fall on same level from slipping, tripping and stumbling with subsequent striking against other object, initial encounter: Secondary | ICD-10-CM | POA: Insufficient documentation

## 2021-07-01 DIAGNOSIS — Y990 Civilian activity done for income or pay: Secondary | ICD-10-CM | POA: Diagnosis not present

## 2021-07-01 MED ORDER — LIDOCAINE-EPINEPHRINE-TETRACAINE (LET) TOPICAL GEL
3.0000 mL | Freq: Once | TOPICAL | Status: AC
  Administered 2021-07-01: 3 mL via TOPICAL
  Filled 2021-07-01: qty 3

## 2021-07-01 MED ORDER — TRAMADOL HCL 50 MG PO TABS
50.0000 mg | ORAL_TABLET | Freq: Once | ORAL | Status: AC
  Administered 2021-07-01: 50 mg via ORAL
  Filled 2021-07-01: qty 1

## 2021-07-01 MED ORDER — TETANUS-DIPHTH-ACELL PERTUSSIS 5-2.5-18.5 LF-MCG/0.5 IM SUSY
0.5000 mL | PREFILLED_SYRINGE | Freq: Once | INTRAMUSCULAR | Status: AC
  Administered 2021-07-01: 0.5 mL via INTRAMUSCULAR
  Filled 2021-07-01: qty 0.5

## 2021-07-01 MED ORDER — BACITRACIN-NEOMYCIN-POLYMYXIN 400-5-5000 EX OINT
TOPICAL_OINTMENT | Freq: Once | CUTANEOUS | Status: AC
  Administered 2021-07-01: 1 via TOPICAL
  Filled 2021-07-01: qty 1

## 2021-07-01 NOTE — ED Provider Notes (Signed)
[facility] Emergency Department Provider Note  ____________________________________________   Event Date/Time   First MD Initiated Contact with Patient 07/01/21 0930     (approximate)  I have reviewed the triage vital signs and the nursing notes.   HISTORY  Chief Complaint Fall    HPI Joanna Nielsen is a 60 y.o. female presents emergency department after a trip and fall at work.  Patient states she tripped on a mat that they used to stand on to help your feet and back.  States she is supposed to get off work but then found out the other person was not going to show up so had to stay late.  Incident happened around 8:00 this morning.  No LOC.  Has severe headache.  Also has left knee pain.  No vomiting.  No slurred speech.  Unsure of last Tdap  Past Medical History:  Diagnosis Date   Irregular heartbeat     Patient Active Problem List   Diagnosis Date Noted   Allergy 11/09/2019   Complete rupture of rotator cuff 11/09/2019   Migraine 11/09/2019   Obesity 11/09/2019   Swelling of limb 11/09/2019   Asthma, stable, mild intermittent 09/20/2015   Prediabetes 09/20/2015    Past Surgical History:  Procedure Laterality Date   ABDOMINAL HYSTERECTOMY     FRACTURE SURGERY      Prior to Admission medications   Medication Sig Start Date End Date Taking? Authorizing Provider  ALPRAZolam Prudy Feeler) 0.5 MG tablet Take 0.5 mg by mouth 2 (two) times daily as needed. 10/20/19   [provider]    Allergies Patient has no known allergies.  Family History  Problem Relation Age of Onset   Pancreatic cancer Father    Diabetes Sister    Anxiety disorder Brother    Diabetes Maternal Grandfather     Social History Social History   Tobacco Use   Smoking status: Never   Smokeless tobacco: Never  Substance Use Topics   Alcohol use: No   Drug use: Never    Review of Systems  Constitutional: No fever/chills Eyes: No visual changes. ENT: No  sore throat. Respiratory: Denies cough Cardiovascular: Denies chest pain Gastrointestinal: Denies abdominal pain Genitourinary: Negative for dysuria. Musculoskeletal: Negative for back pain.  Positive for left knee pain Skin: Negative for rash.  Positive scalp laceration Psychiatric: no mood changes,     ____________________________________________   PHYSICAL EXAM:  VITAL SIGNS: ED Triage Vitals [07/01/21 0921]  Enc Vitals Group     BP (!) 147/81     Pulse Rate 70     Resp 19     Temp 97.7 F (36.5 C)     Temp Source Oral     SpO2 98 %     Weight (!) 364 lb (165.1 kg)     Height 5\' 10"  (1.778 m)     Head Circumference      Peak Flow      Pain Score      Pain Loc      Pain Edu?      Excl. in GC?     Constitutional: Alert and oriented. Well appearing and in no acute distress. Eyes: Conjunctivae are normal.  Head: Laceration noted along the center of the scalp at the forehead, no active bleeding at this time, no foreign body Nose: No congestion/rhinnorhea. Mouth/Throat: Mucous membranes are moist.   Neck:  supple no lymphadenopathy noted Cardiovascular: Normal rate, regular rhythm.  Respiratory: Normal respiratory effort.  No retractions,  GU: deferred Musculoskeletal: FROM all extremities, warm and well perfused, pain of the left knee is reproduced with range of motion, left knee is very tender to palpation, neurovascular is intact Neurologic:  Normal speech and language.  Skin:  Skin is warm, dry and intact. No rash noted. Psychiatric: Mood and affect are normal. Speech and behavior are normal.  ____________________________________________   LABS (all labs ordered are listed, but only abnormal results are displayed)  Labs Reviewed - No data to display ____________________________________________   ____________________________________________  RADIOLOGY  CT of the head, x-ray of the left  knee  ____________________________________________   PROCEDURES  Procedure(s) performed:   Marland KitchenMarland KitchenLaceration Repair  Date/Time: 07/01/2021 1:00 PM Performed by: Faythe Ghee, PA-C Authorized by: Faythe Ghee, PA-C   Consent:    Consent obtained:  Verbal   Consent given by:  Patient   Risks, benefits, and alternatives were discussed: yes     Risks discussed:  Infection, pain, retained foreign body, tendon damage, poor cosmetic result, need for additional repair, nerve damage, poor wound healing and vascular damage Universal protocol:    Procedure explained and questions answered to patient or proxy's satisfaction: yes     Patient identity confirmed:  Verbally with patient Anesthesia:    Anesthesia method:  Topical application   Topical anesthetic:  LET Laceration details:    Location:  Scalp   Length (cm):  3 Pre-procedure details:    Preparation:  Patient was prepped and draped in usual sterile fashion Exploration:    Limited defect created (wound extended): no     Hemostasis achieved with:  LET   Imaging outcome: foreign body not noted     Wound exploration: wound explored through full range of motion     Wound extent: no fascia violation noted, no foreign bodies/material noted, no muscle damage noted, no nerve damage noted, no tendon damage noted, no underlying fracture noted and no vascular damage noted     Contaminated: no   Treatment:    Area cleansed with:  Saline   Amount of cleaning:  Standard   Irrigation solution:  Sterile saline   Irrigation method:  Tap   Debridement:  None   Undermining:  None Skin repair:    Repair method:  Staples   Number of staples:  3 Approximation:    Approximation:  Close Repair type:    Repair type:  Simple Post-procedure details:    Dressing:  Antibiotic ointment   Procedure completion:  Tolerated well, no immediate complications    ____________________________________________   INITIAL IMPRESSION / ASSESSMENT AND  PLAN / ED COURSE  Pertinent labs & imaging results that were available during my care of the patient were reviewed by me and considered in my medical decision making (see chart for details).   The patient is a 60 year old female presents after a fall at work.  See HPI.  Physical exam shows patient appears stable  CT of the head to rule out brain injury or skull fracture X-ray of the left knee to rule out fracture Tdap will be updated  Ct head is negative for any acute abnormality Xray of the left knee reviewed by me confirmed by radiology to be negative for fracture  See procedure not for laceration repair  I did discuss all of the findings with the patient.  Worker's Comp. restrictions were provided on a work note.  Staples should be removed in 10 days.  Tdap was updated here in the ED.  Patient  is take Tylenol or ibuprofen for pain as needed.  States she does have a walker at home that she can use if having knee pain.  She was discharged in stable condition.  Instructions to follow-up with a urgent care or orthopedist of Worker's Comp. choice if not improving in 1 week.    Joanna Nielsen was evaluated in Emergency Department on 07/01/2021 for the symptoms described in the history of present illness. She was evaluated in the context of the global COVID-19 pandemic, which necessitated consideration that the patient might be at risk for infection with the SARS-CoV-2 virus that causes COVID-19. Institutional protocols and algorithms that pertain to the evaluation of patients at risk for COVID-19 are in a state of rapid change based on information released by regulatory bodies including the CDC and federal and state organizations. These policies and algorithms were followed during the patient's care in the ED.    As part of my medical decision making, I reviewed the following data within the electronic MEDICAL RECORD NUMBER Nursing notes reviewed and incorporated, Old chart reviewed, Radiograph reviewed  , Notes from prior ED visits, and [place] Controlled Substance Database  ____________________________________________   FINAL CLINICAL IMPRESSION(S) / ED DIAGNOSES  Final diagnoses:  Fall, initial encounter  Contusion of left knee, initial encounter  Minor head injury, initial encounter  Scalp laceration, initial encounter      NEW MEDICATIONS STARTED DURING THIS VISIT:  Discharge Medication List as of 07/01/2021 11:47 AM       Note:  This document was prepared using Dragon voice recognition software and may include unintentional dictation errors.    Faythe Ghee, PA-C 07/01/21 1305    Dionne Bucy, MD 07/01/21 [phone]

## 2021-07-01 NOTE — ED Triage Notes (Addendum)
Presents via EMS s/p trip and fall at [employer] she tripped on an edge of carpet  fell   hitting her head  laceration to top of head    no LOC

## 2021-07-01 NOTE — Discharge Instructions (Signed)
Follow up with one of the urgent cares or the ER for suture removal Keep the area as clean and dry as possible Apply ice to the scalp and left knee If your knee continues to hurt in 1 week, follow up with orthopedics of workers comp choice Return if worsening

## 2021-09-17 DIAGNOSIS — R7303 Prediabetes: Secondary | ICD-10-CM | POA: Diagnosis not present

## 2021-09-17 DIAGNOSIS — R600 Localized edema: Secondary | ICD-10-CM | POA: Diagnosis not present

## 2021-09-17 DIAGNOSIS — I878 Other specified disorders of veins: Secondary | ICD-10-CM | POA: Diagnosis not present

## 2021-09-24 ENCOUNTER — Other Ambulatory Visit: Payer: Self-pay

## 2021-09-24 ENCOUNTER — Ambulatory Visit (INDEPENDENT_AMBULATORY_CARE_PROVIDER_SITE_OTHER): Payer: BC Managed Care – PPO | Admitting: Nurse Practitioner

## 2021-09-24 ENCOUNTER — Encounter (INDEPENDENT_AMBULATORY_CARE_PROVIDER_SITE_OTHER): Payer: Self-pay | Admitting: Nurse Practitioner

## 2021-09-24 VITALS — BP 158/85 | HR 83 | Resp 16 | Ht 71.0 in | Wt 361.0 lb

## 2021-09-24 DIAGNOSIS — I890 Lymphedema, not elsewhere classified: Secondary | ICD-10-CM | POA: Diagnosis not present

## 2021-09-24 NOTE — Progress Notes (Signed)
Subjective:    Patient ID: Joanna Nielsen, female    DOB: 05/24/1961, 60 y.o.   MRN: [id number] Chief Complaint  Patient presents with   Follow-up    6 months no studies    Joanna Nielsen is a 60 year old female that returns to the office for followup evaluation regarding leg swelling.  The swelling has persisted and the pain associated with swelling continues. There have not been any interval development of a ulcerations or wounds.  Since the previous visit the patient has been wearing graduated compression stockings and has noted little if any improvement in the lymphedema. The patient has been using compression routinely morning until night.  She has been using her lymphedema pump until recently however became too painful for her to use for short time.  The patient also states elevation during the day and exercise is being done too.      Review of Systems  Cardiovascular:  Positive for leg swelling.  All other systems reviewed and are negative.     Objective:   Physical Exam Vitals reviewed.  HENT:     Head: Normocephalic.  Cardiovascular:     Rate and Rhythm: Normal rate.     Pulses: Normal pulses.  Pulmonary:     Effort: Pulmonary effort is normal.  Musculoskeletal:     Right lower leg: 3+ Edema present.     Left lower leg: 3+ Edema present.  Neurological:     Mental Status: She is alert and oriented to person, place, and time.  Psychiatric:        Mood and Affect: Mood normal.        Behavior: Behavior normal.        Thought Content: Thought content normal.        Judgment: Judgment normal.    BP (!) 158/85 (BP Location: Left Arm)   Pulse 83   Resp 16   Ht 5\' 11"  (1.803 m)   Wt (!) 361 lb (163.7 kg)   BMI 50.35 kg/m   Past Medical History:  Diagnosis Date   Irregular heartbeat     Social History   Socioeconomic History   Marital status: Married    Spouse name: Not on file   Number of children: Not on file   Years of education: Not on file    Highest education level: Not on file  Occupational History   Not on file  Tobacco Use   Smoking status: Never   Smokeless tobacco: Never  Substance and Sexual Activity   Alcohol use: No   Drug use: Never   Sexual activity: Not on file  Other Topics Concern   Not on file  Social History Narrative   Not on file   Social Determinants of Health   Financial Resource Strain: Not on file  Food Insecurity: Not on file  Transportation Needs: Not on file  Physical Activity: Not on file  Stress: Not on file  Social Connections: Not on file  Intimate Partner Violence: Not on file    Past Surgical History:  Procedure Laterality Date   ABDOMINAL HYSTERECTOMY     FRACTURE SURGERY      Family History  Problem Relation Age of Onset   Pancreatic cancer Father    Diabetes Sister    Anxiety disorder Brother    Diabetes Maternal Grandfather     No Known Allergies  No flowsheet data found.    CMP  No results found for: NA, K, CL, CO2, GLUCOSE,  BUN, CREATININE, CALCIUM, PROT, ALBUMIN, AST, ALT, ALKPHOS, BILITOT, GFRNONAA, GFRAA   No results found.     Assessment & Plan:   1. Lymphedema No surgery or intervention at this point in time.    I have had a long discussion with the patient regarding venous insufficiency and why it  causes symptoms, specifically venous ulceration . I have discussed with the patient the chronic skin changes that accompany venous insufficiency and the long term sequela such as infection and recurring  ulceration.  Patient will be placed in [employer] which will be changed weekly drainage permitting.  In addition, behavioral modification including several periods of elevation of the lower extremities during the day will be continued. Achieving a position with the ankles at heart level was stressed to the patient  The patient is instructed to begin routine exercise, especially walking on a daily basis  Patient should undergo duplex ultrasound of the  venous system to ensure that DVT or reflux is not present.  Following the review of the ultrasound the patient will follow up in four weeks to reassess the degree of swelling and the control that Unna therapy is offering.  The patient should continue with lymph pump usage.    Current Outpatient Medications on File Prior to Visit  Medication Sig Dispense Refill   aspirin-acetaminophen-caffeine (EXCEDRIN MIGRAINE) 250-250-65 MG tablet Take by mouth every 6 (six) hours as needed for headache.     escitalopram (LEXAPRO) 10 MG tablet Take 10 mg by mouth daily.     furosemide (LASIX) 20 MG tablet Take 20 mg by mouth.     ibuprofen (ADVIL) 200 MG tablet Take 200 mg by mouth every 6 (six) hours as needed.     ALPRAZolam (XANAX) 0.5 MG tablet Take 0.5 mg by mouth 2 (two) times daily as needed. (Patient not taking: Reported on 09/24/2021)     No current facility-administered medications on file prior to visit.    There are no Patient Instructions on file for this visit. No follow-ups on file.   [name], NP

## 2021-10-01 ENCOUNTER — Other Ambulatory Visit: Payer: Self-pay

## 2021-10-01 ENCOUNTER — Ambulatory Visit (INDEPENDENT_AMBULATORY_CARE_PROVIDER_SITE_OTHER): Payer: BC Managed Care – PPO | Admitting: Nurse Practitioner

## 2021-10-01 VITALS — BP 172/83 | HR 71 | Resp 17 | Ht 71.0 in | Wt 366.0 lb

## 2021-10-01 DIAGNOSIS — I890 Lymphedema, not elsewhere classified: Secondary | ICD-10-CM

## 2021-10-01 NOTE — Progress Notes (Signed)
History of Present Illness  There is no documented history at this time  Assessments & Plan   There are no diagnoses linked to this encounter.    Additional instructions  Subjective:  Patient presents with venous ulcer of the Bilateral lower extremity.    Procedure:  3 layer unna wrap was placed Bilateral lower extremity.   Plan:   Follow up in one week.  

## 2021-10-08 ENCOUNTER — Encounter (INDEPENDENT_AMBULATORY_CARE_PROVIDER_SITE_OTHER): Payer: Self-pay | Admitting: Nurse Practitioner

## 2021-10-09 ENCOUNTER — Other Ambulatory Visit: Payer: Self-pay

## 2021-10-09 ENCOUNTER — Encounter (INDEPENDENT_AMBULATORY_CARE_PROVIDER_SITE_OTHER): Payer: Self-pay | Admitting: Nurse Practitioner

## 2021-10-09 ENCOUNTER — Ambulatory Visit (INDEPENDENT_AMBULATORY_CARE_PROVIDER_SITE_OTHER): Payer: BC Managed Care – PPO | Admitting: Nurse Practitioner

## 2021-10-09 VITALS — BP 172/89 | HR 71 | Ht 71.0 in | Wt 371.0 lb

## 2021-10-09 DIAGNOSIS — I890 Lymphedema, not elsewhere classified: Secondary | ICD-10-CM | POA: Diagnosis not present

## 2021-10-09 NOTE — Progress Notes (Signed)
History of Present Illness  There is no documented history at this time  Assessments & Plan   There are no diagnoses linked to this encounter.    Additional instructions  Subjective:  Patient presents with venous ulcer of the Bilateral lower extremity.    Procedure:  3 layer unna wrap was placed Bilateral lower extremity.   Plan:   Follow up in one week.  

## 2021-10-16 ENCOUNTER — Other Ambulatory Visit: Payer: Self-pay

## 2021-10-16 ENCOUNTER — Ambulatory Visit (INDEPENDENT_AMBULATORY_CARE_PROVIDER_SITE_OTHER): Payer: BC Managed Care – PPO | Admitting: Nurse Practitioner

## 2021-10-16 ENCOUNTER — Encounter (INDEPENDENT_AMBULATORY_CARE_PROVIDER_SITE_OTHER): Payer: Self-pay | Admitting: Nurse Practitioner

## 2021-10-16 VITALS — BP 154/75 | HR 78 | Resp 16 | Wt 373.0 lb

## 2021-10-16 DIAGNOSIS — I890 Lymphedema, not elsewhere classified: Secondary | ICD-10-CM

## 2021-10-16 NOTE — Progress Notes (Signed)
History of Present Illness  There is no documented history at this time  Assessments & Plan   There are no diagnoses linked to this encounter.    Additional instructions  Subjective:  Patient presents with venous ulcer of the Bilateral lower extremity.    Procedure:  3 layer unna wrap was placed Bilateral lower extremity.   Plan:   Follow up in one week.  

## 2021-10-21 ENCOUNTER — Encounter (INDEPENDENT_AMBULATORY_CARE_PROVIDER_SITE_OTHER): Payer: Self-pay | Admitting: Nurse Practitioner

## 2021-10-23 ENCOUNTER — Ambulatory Visit (INDEPENDENT_AMBULATORY_CARE_PROVIDER_SITE_OTHER): Payer: BC Managed Care – PPO | Admitting: Nurse Practitioner

## 2021-10-23 ENCOUNTER — Other Ambulatory Visit: Payer: Self-pay

## 2021-10-23 VITALS — BP 148/92 | HR 80 | Resp 16 | Ht 71.0 in | Wt 378.0 lb

## 2021-10-23 DIAGNOSIS — I890 Lymphedema, not elsewhere classified: Secondary | ICD-10-CM

## 2021-10-23 NOTE — Progress Notes (Signed)
History of Present Illness  There is no documented history at this time  Assessments & Plan   There are no diagnoses linked to this encounter.    Additional instructions  Subjective:  Patient presents with venous ulcer of the Bilateral lower extremity.    Procedure:  3 layer unna wrap was placed Bilateral lower extremity.   Plan:   Follow up in one week.  

## 2021-10-28 ENCOUNTER — Encounter (INDEPENDENT_AMBULATORY_CARE_PROVIDER_SITE_OTHER): Payer: Self-pay | Admitting: Nurse Practitioner

## 2021-10-29 ENCOUNTER — Other Ambulatory Visit: Payer: Self-pay | Admitting: Physician Assistant

## 2021-10-29 DIAGNOSIS — Z0000 Encounter for general adult medical examination without abnormal findings: Secondary | ICD-10-CM | POA: Diagnosis not present

## 2021-10-29 DIAGNOSIS — Z1211 Encounter for screening for malignant neoplasm of colon: Secondary | ICD-10-CM | POA: Diagnosis not present

## 2021-10-29 DIAGNOSIS — Z131 Encounter for screening for diabetes mellitus: Secondary | ICD-10-CM | POA: Diagnosis not present

## 2021-10-29 DIAGNOSIS — Z1231 Encounter for screening mammogram for malignant neoplasm of breast: Secondary | ICD-10-CM

## 2021-10-29 DIAGNOSIS — Z13220 Encounter for screening for lipoid disorders: Secondary | ICD-10-CM | POA: Diagnosis not present

## 2021-10-29 DIAGNOSIS — Z1389 Encounter for screening for other disorder: Secondary | ICD-10-CM | POA: Diagnosis not present

## 2021-10-30 ENCOUNTER — Other Ambulatory Visit: Payer: Self-pay

## 2021-10-30 ENCOUNTER — Encounter (INDEPENDENT_AMBULATORY_CARE_PROVIDER_SITE_OTHER): Payer: Self-pay | Admitting: Nurse Practitioner

## 2021-10-30 ENCOUNTER — Ambulatory Visit (INDEPENDENT_AMBULATORY_CARE_PROVIDER_SITE_OTHER): Payer: BC Managed Care – PPO | Admitting: Nurse Practitioner

## 2021-10-30 VITALS — BP 145/82 | HR 81 | Resp 16 | Ht 71.0 in | Wt 381.0 lb

## 2021-10-30 DIAGNOSIS — I890 Lymphedema, not elsewhere classified: Secondary | ICD-10-CM

## 2021-10-30 DIAGNOSIS — J4520 Mild intermittent asthma, uncomplicated: Secondary | ICD-10-CM

## 2021-11-05 DIAGNOSIS — Z1231 Encounter for screening mammogram for malignant neoplasm of breast: Secondary | ICD-10-CM | POA: Diagnosis not present

## 2021-11-06 DIAGNOSIS — Z1211 Encounter for screening for malignant neoplasm of colon: Secondary | ICD-10-CM | POA: Diagnosis not present

## 2021-11-11 ENCOUNTER — Encounter (INDEPENDENT_AMBULATORY_CARE_PROVIDER_SITE_OTHER): Payer: Self-pay | Admitting: Nurse Practitioner

## 2021-11-11 NOTE — Progress Notes (Signed)
Subjective:    Patient ID: Joanna Nielsen, female    DOB: [birth year], 61 y.o.   MRN: [id number] Chief Complaint  Patient presents with   Follow-up    Ultrasound    Joanna Nielsen is a 61 year old female that returns today for evaluation of lower extremity edema.  The patient initially had significant lower extremity edema that may very difficult for her to do everyday activities.  She has known lymphedema and due to the severe swelling she was not able to use her lymphedema pump.  She had been in [place] wraps for the last several weeks and now her swelling is drastically decreased.  She notes that the pain that she was feeling previously is resolved.  Overall, the wraps have been very helpful for her legs.   Review of Systems  Cardiovascular:  Positive for leg swelling.  All other systems reviewed and are negative.     Objective:   Physical Exam Vitals reviewed.  HENT:     Head: Normocephalic.  Cardiovascular:     Rate and Rhythm: Normal rate.  Pulmonary:     Effort: Pulmonary effort is normal.  Musculoskeletal:     Right lower leg: 1+ Edema present.     Left lower leg: 1+ Edema present.  Skin:    General: Skin is warm and dry.  Neurological:     Mental Status: She is alert and oriented to person, place, and time.  Psychiatric:        Mood and Affect: Mood normal.        Behavior: Behavior normal.        Thought Content: Thought content normal.        Judgment: Judgment normal.    BP (!) 145/82 (BP Location: Left Arm)    Pulse 81    Resp 16    Ht 5\' 11"  (1.803 m)    Wt (!) 381 lb (172.8 kg)    BMI 53.14 kg/m   Past Medical History:  Diagnosis Date   Irregular heartbeat     Social History   Socioeconomic History   Marital status: Married    Spouse name: Not on file   Number of children: Not on file   Years of education: Not on file   Highest education level: Not on file  Occupational History   Not on file  Tobacco Use   Smoking status: Never   Smokeless  tobacco: Never  Substance and Sexual Activity   Alcohol use: No   Drug use: Never   Sexual activity: Not on file  Other Topics Concern   Not on file  Social History Narrative   Not on file   Social Determinants of Health   Financial Resource Strain: Not on file  Food Insecurity: Not on file  Transportation Needs: Not on file  Physical Activity: Not on file  Stress: Not on file  Social Connections: Not on file  Intimate Partner Violence: Not on file    Past Surgical History:  Procedure Laterality Date   ABDOMINAL HYSTERECTOMY     FRACTURE SURGERY      Family History  Problem Relation Age of Onset   Pancreatic cancer Father    Diabetes Sister    Anxiety disorder Brother    Diabetes Maternal Grandfather     No Known Allergies  No flowsheet data found.    CMP  No results found for: NA, K, CL, CO2, GLUCOSE, BUN, CREATININE, CALCIUM, PROT, ALBUMIN, AST, ALT, ALKPHOS, BILITOT, GFRNONAA, GFRAA  No results found.     Assessment & Plan:   1. Lymphedema The patient's lower extremity edema is much improved following several weeks of Unna wraps.  The patient is encouraged to return to using medical grade compression stockings daily as well as elevating and using her lymphedema pump for an hour on a daily basis.  We will have patient return in 6 weeks to see how her progress with leg swelling and conservative therapy goes.  Patient is advised to contact us sooner if her swelling drastically increases prior to her follow-up date.  2. Asthma, stable, mild intermittent Continue pulmonary medications and aerosols as already ordered, these medications have been reviewed and there are no changes at this time.     Current Outpatient Medications on File Prior to Visit  Medication Sig Dispense Refill   ALPRAZolam (XANAX) 0.5 MG tablet Take 0.5 mg by mouth 2 (two) times daily as needed.     aspirin-acetaminophen-caffeine (EXCEDRIN MIGRAINE) 250-250-65 MG tablet Take by mouth  every 6 (six) hours as needed for headache.     furosemide (LASIX) 20 MG tablet Take 20 mg by mouth.     ibuprofen (ADVIL) 200 MG tablet Take 200 mg by mouth every 6 (six) hours as needed.     escitalopram (LEXAPRO) 10 MG tablet Take 10 mg by mouth daily. (Patient not taking: Reported on 10/30/2021)     No current facility-administered medications on file prior to visit.    There are no Patient Instructions on file for this visit. No follow-ups on file.   Kris Hartmann, NP

## 2021-11-13 LAB — EXTERNAL GENERIC LAB PROCEDURE: COLOGUARD: NEGATIVE

## 2021-11-13 LAB — COLOGUARD: COLOGUARD: NEGATIVE

## 2021-11-14 ENCOUNTER — Encounter (INDEPENDENT_AMBULATORY_CARE_PROVIDER_SITE_OTHER): Payer: Self-pay

## 2021-11-14 ENCOUNTER — Ambulatory Visit (INDEPENDENT_AMBULATORY_CARE_PROVIDER_SITE_OTHER): Payer: BC Managed Care – PPO | Admitting: Nurse Practitioner

## 2021-11-14 ENCOUNTER — Other Ambulatory Visit: Payer: Self-pay

## 2021-11-14 VITALS — BP 137/86 | HR 78 | Resp 16 | Wt 380.0 lb

## 2021-11-14 DIAGNOSIS — I890 Lymphedema, not elsewhere classified: Secondary | ICD-10-CM | POA: Diagnosis not present

## 2021-11-14 NOTE — Progress Notes (Signed)
History of Present Illness  There is no documented history at this time  Assessments & Plan   There are no diagnoses linked to this encounter.    Additional instructions  Subjective:  Patient presents with venous ulcer of the Left lower extremity.    Procedure:  3 layer unna wrap was placed Left lower extremity.   Plan:   Follow up in one week.  

## 2021-11-18 ENCOUNTER — Encounter (INDEPENDENT_AMBULATORY_CARE_PROVIDER_SITE_OTHER): Payer: Self-pay | Admitting: Nurse Practitioner

## 2021-11-21 ENCOUNTER — Ambulatory Visit (INDEPENDENT_AMBULATORY_CARE_PROVIDER_SITE_OTHER): Payer: BC Managed Care – PPO | Admitting: Nurse Practitioner

## 2021-11-21 ENCOUNTER — Other Ambulatory Visit: Payer: Self-pay

## 2021-11-21 VITALS — BP 151/80 | HR 80 | Resp 16 | Ht 71.0 in | Wt 377.0 lb

## 2021-11-21 DIAGNOSIS — I890 Lymphedema, not elsewhere classified: Secondary | ICD-10-CM | POA: Diagnosis not present

## 2021-11-21 NOTE — Progress Notes (Signed)
History of Present Illness  There is no documented history at this time  Assessments & Plan   There are no diagnoses linked to this encounter.    Additional instructions  Subjective:  Patient presents with venous ulcer of the Bilateral lower extremity.    Procedure:  3 layer unna wrap was placed Bilateral lower extremity.   Plan:   Follow up in one week.  

## 2021-11-25 ENCOUNTER — Encounter (INDEPENDENT_AMBULATORY_CARE_PROVIDER_SITE_OTHER): Payer: Self-pay | Admitting: Nurse Practitioner

## 2021-11-28 ENCOUNTER — Encounter (INDEPENDENT_AMBULATORY_CARE_PROVIDER_SITE_OTHER): Payer: Self-pay

## 2021-11-28 ENCOUNTER — Other Ambulatory Visit: Payer: Self-pay

## 2021-11-28 ENCOUNTER — Ambulatory Visit (INDEPENDENT_AMBULATORY_CARE_PROVIDER_SITE_OTHER): Payer: BC Managed Care – PPO | Admitting: Nurse Practitioner

## 2021-11-28 VITALS — BP 155/85 | HR 79 | Resp 16 | Wt 380.0 lb

## 2021-11-28 DIAGNOSIS — I890 Lymphedema, not elsewhere classified: Secondary | ICD-10-CM | POA: Diagnosis not present

## 2021-11-28 MED ORDER — FLUCONAZOLE 150 MG PO TABS: ORAL_TABLET | ORAL | 0 refills | Status: DC

## 2021-11-28 NOTE — Progress Notes (Signed)
History of Present Illness  There is no documented history at this time  Assessments & Plan   There are no diagnoses linked to this encounter.    Additional instructions  Subjective:  Patient presents with venous ulcer of the Bilateral lower extremity.    Procedure:  3 layer unna wrap was placed Bilateral lower extremity.   Plan:   Follow up in one week.  

## 2021-12-02 ENCOUNTER — Encounter (INDEPENDENT_AMBULATORY_CARE_PROVIDER_SITE_OTHER): Payer: Self-pay | Admitting: Nurse Practitioner

## 2021-12-05 ENCOUNTER — Ambulatory Visit (INDEPENDENT_AMBULATORY_CARE_PROVIDER_SITE_OTHER): Payer: BC Managed Care – PPO | Admitting: Nurse Practitioner

## 2021-12-05 ENCOUNTER — Other Ambulatory Visit: Payer: Self-pay

## 2021-12-05 VITALS — BP 157/85 | HR 73 | Resp 17 | Ht 71.0 in | Wt 376.0 lb

## 2021-12-05 DIAGNOSIS — I890 Lymphedema, not elsewhere classified: Secondary | ICD-10-CM | POA: Diagnosis not present

## 2021-12-05 NOTE — Progress Notes (Signed)
History of Present Illness  There is no documented history at this time  Assessments & Plan   There are no diagnoses linked to this encounter.    Additional instructions  Subjective:  Patient presents with venous ulcer of the Bilateral lower extremity.    Procedure:  3 layer unna wrap was placed Bilateral lower extremity.   Plan:   Follow up in one week.  

## 2021-12-09 ENCOUNTER — Encounter (INDEPENDENT_AMBULATORY_CARE_PROVIDER_SITE_OTHER): Payer: Self-pay | Admitting: Nurse Practitioner

## 2021-12-11 ENCOUNTER — Ambulatory Visit (INDEPENDENT_AMBULATORY_CARE_PROVIDER_SITE_OTHER): Payer: BC Managed Care – PPO | Admitting: Nurse Practitioner

## 2021-12-12 ENCOUNTER — Ambulatory Visit (INDEPENDENT_AMBULATORY_CARE_PROVIDER_SITE_OTHER): Payer: BC Managed Care – PPO | Admitting: Nurse Practitioner

## 2021-12-12 ENCOUNTER — Encounter (INDEPENDENT_AMBULATORY_CARE_PROVIDER_SITE_OTHER): Payer: Self-pay | Admitting: Nurse Practitioner

## 2021-12-12 ENCOUNTER — Encounter (INDEPENDENT_AMBULATORY_CARE_PROVIDER_SITE_OTHER): Payer: BC Managed Care – PPO

## 2021-12-12 ENCOUNTER — Other Ambulatory Visit: Payer: Self-pay

## 2021-12-12 VITALS — BP 122/69 | HR 81 | Resp 17 | Ht 71.0 in | Wt 378.2 lb

## 2021-12-12 DIAGNOSIS — I890 Lymphedema, not elsewhere classified: Secondary | ICD-10-CM

## 2021-12-12 MED ORDER — SULFAMETHOXAZOLE-TRIMETHOPRIM 800-160 MG PO TABS: 1.0000 | ORAL_TABLET | Freq: Two times a day (BID) | ORAL | 0 refills | Status: DC

## 2021-12-19 ENCOUNTER — Encounter (INDEPENDENT_AMBULATORY_CARE_PROVIDER_SITE_OTHER): Payer: Self-pay | Admitting: Nurse Practitioner

## 2021-12-19 ENCOUNTER — Other Ambulatory Visit: Payer: Self-pay

## 2021-12-19 ENCOUNTER — Ambulatory Visit (INDEPENDENT_AMBULATORY_CARE_PROVIDER_SITE_OTHER): Payer: BC Managed Care – PPO | Admitting: Nurse Practitioner

## 2021-12-19 VITALS — BP 176/72 | HR 90 | Resp 16 | Wt 377.0 lb

## 2021-12-19 DIAGNOSIS — I890 Lymphedema, not elsewhere classified: Secondary | ICD-10-CM

## 2021-12-19 NOTE — Progress Notes (Signed)
History of Present Illness  There is no documented history at this time  Assessments & Plan   There are no diagnoses linked to this encounter.    Additional instructions  Subjective:  Patient presents with venous ulcer of the Bilateral lower extremity.    Procedure:  3 layer unna wrap was placed Bilateral lower extremity.   Plan:   Follow up in one week.  

## 2021-12-23 NOTE — Progress Notes (Signed)
? ?Subjective:  ? ? Patient ID: Joanna Nielsen, female    DOB: 1961/09/16, 61 y.o.   MRN: [id number] ?Chief Complaint  ?Patient presents with  ? Follow-up  ?  Unna boot follow up  ? ? ?The patient presents today for follow-up after being in wraps for several weeks.  She notes that she had some issues with the right lower extremity and wrapping the wrap fell down.  She has evidence of cellulitis in this area.  However the weeping that was happening in the right lower extremity has resolved. ? ? ?Review of Systems  ?Cardiovascular:  Positive for leg swelling.  ?Skin:  Positive for color change.  ?All other systems reviewed and are negative. ? ?   ?Objective:  ? Physical Exam ?Vitals reviewed.  ?HENT:  ?   Head: Normocephalic.  ?Cardiovascular:  ?   Rate and Rhythm: Normal rate.  ?Pulmonary:  ?   Effort: Pulmonary effort is normal.  ?Musculoskeletal:  ?   Right lower leg: 2+ Edema present.  ?   Left lower leg: 2+ Edema present.  ?Skin: ?   General: Skin is warm and dry.  ?Neurological:  ?   Mental Status: She is alert and oriented to person, place, and time.  ?Psychiatric:     ?   Mood and Affect: Mood normal.     ?   Behavior: Behavior normal.     ?   Thought Content: Thought content normal.     ?   Judgment: Judgment normal.  ? ? ?BP 122/69   Pulse 81   Resp 17   Ht 5\' 11"  (1.803 m)   Wt (!) 378 lb 3.2 oz (171.6 kg)   BMI 52.75 kg/m?  ? ?Past Medical History:  ?Diagnosis Date  ? Irregular heartbeat   ? ? ?Social History  ? ?Socioeconomic History  ? Marital status: Married  ?  Spouse name: Not on file  ? Number of children: Not on file  ? Years of education: Not on file  ? Highest education level: Not on file  ?Occupational History  ? Not on file  ?Tobacco Use  ? Smoking status: Never  ? Smokeless tobacco: Never  ?Substance and Sexual Activity  ? Alcohol use: No  ? Drug use: Never  ? Sexual activity: Not on file  ?Other Topics Concern  ? Not on file  ?Social History Narrative  ? Not on file  ? ?Social  Determinants of Health  ? ?Financial Resource Strain: Not on file  ?Food Insecurity: Not on file  ?Transportation Needs: Not on file  ?Physical Activity: Not on file  ?Stress: Not on file  ?Social Connections: Not on file  ?Intimate Partner Violence: Not on file  ? ? ?Past Surgical History:  ?Procedure Laterality Date  ? ABDOMINAL HYSTERECTOMY    ? FRACTURE SURGERY    ? ? ?Family History  ?Problem Relation Age of Onset  ? Pancreatic cancer Father   ? Diabetes Sister   ? Anxiety disorder Brother   ? Diabetes Maternal Grandfather   ? ? ?No Known Allergies ? ?No flowsheet data found. ? ? ? ?CMP  ?No results found for: NA, K, CL, CO2, GLUCOSE, BUN, CREATININE, CALCIUM, PROT, ALBUMIN, AST, ALT, ALKPHOS, BILITOT, GFRNONAA, GFRAA ? ? ?No results found. ? ?   ?Assessment & Plan:  ? ?1. Lymphedema ?The patient had an issue with her right groin down.  Appears she has some cellulitis in her right lower extremity.  Will prescribe antibiotics  to assist with treatment.  Because the wrap rolled down this leg is much more swollen.  She will continue with the wraps for 4 weeks to help gain control swelling.  Patient is also advised to continue with elevation and activity. ? ? ?Current Outpatient Medications on File Prior to Visit  ?Medication Sig Dispense Refill  ? ALPRAZolam (XANAX) 0.5 MG tablet Take 0.5 mg by mouth 2 (two) times daily as needed.    ? aspirin-acetaminophen-caffeine (EXCEDRIN MIGRAINE) 250-250-65 MG tablet Take by mouth every 6 (six) hours as needed for headache.    ? escitalopram (LEXAPRO) 10 MG tablet Take 10 mg by mouth daily.    ? furosemide (LASIX) 20 MG tablet Take 20 mg by mouth.    ? ibuprofen (ADVIL) 200 MG tablet Take 200 mg by mouth every 6 (six) hours as needed.    ? ?No current facility-administered medications on file prior to visit.  ? ? ?There are no Patient Instructions on file for this visit. ?No follow-ups on file. ? ? ?Kris Hartmann, NP ? ? ?

## 2021-12-24 ENCOUNTER — Encounter (INDEPENDENT_AMBULATORY_CARE_PROVIDER_SITE_OTHER): Payer: Self-pay | Admitting: Nurse Practitioner

## 2021-12-26 ENCOUNTER — Other Ambulatory Visit: Payer: Self-pay

## 2021-12-26 ENCOUNTER — Encounter (INDEPENDENT_AMBULATORY_CARE_PROVIDER_SITE_OTHER): Payer: Self-pay

## 2021-12-26 ENCOUNTER — Ambulatory Visit (INDEPENDENT_AMBULATORY_CARE_PROVIDER_SITE_OTHER): Payer: BC Managed Care – PPO | Admitting: Nurse Practitioner

## 2021-12-26 VITALS — BP 161/79 | HR 87 | Resp 16 | Wt 377.8 lb

## 2021-12-26 DIAGNOSIS — I890 Lymphedema, not elsewhere classified: Secondary | ICD-10-CM

## 2021-12-26 NOTE — Progress Notes (Signed)
History of Present Illness  There is no documented history at this time  Assessments & Plan   There are no diagnoses linked to this encounter.    Additional instructions  Subjective:  Patient presents with venous ulcer of the Bilateral lower extremity.    Procedure:  3 layer unna wrap was placed Bilateral lower extremity.   Plan:   Follow up in one week.  

## 2021-12-30 ENCOUNTER — Encounter (INDEPENDENT_AMBULATORY_CARE_PROVIDER_SITE_OTHER): Payer: Self-pay | Admitting: Nurse Practitioner

## 2022-01-02 ENCOUNTER — Other Ambulatory Visit: Payer: Self-pay

## 2022-01-02 ENCOUNTER — Encounter (INDEPENDENT_AMBULATORY_CARE_PROVIDER_SITE_OTHER): Payer: Self-pay

## 2022-01-02 ENCOUNTER — Ambulatory Visit (INDEPENDENT_AMBULATORY_CARE_PROVIDER_SITE_OTHER): Payer: BC Managed Care – PPO | Admitting: Nurse Practitioner

## 2022-01-02 VITALS — BP 164/73 | HR 86 | Resp 17 | Ht 68.0 in | Wt 380.0 lb

## 2022-01-02 DIAGNOSIS — I890 Lymphedema, not elsewhere classified: Secondary | ICD-10-CM

## 2022-01-02 NOTE — Progress Notes (Signed)
History of Present Illness  There is no documented history at this time  Assessments & Plan   There are no diagnoses linked to this encounter.    Additional instructions  Subjective:  Patient presents with venous ulcer of the Bilateral lower extremity.    Procedure:  3 layer unna wrap was placed Bilateral lower extremity.   Plan:   Follow up in one week.  

## 2022-01-09 ENCOUNTER — Encounter (INDEPENDENT_AMBULATORY_CARE_PROVIDER_SITE_OTHER): Payer: Self-pay

## 2022-01-09 ENCOUNTER — Other Ambulatory Visit: Payer: Self-pay

## 2022-01-09 ENCOUNTER — Ambulatory Visit (INDEPENDENT_AMBULATORY_CARE_PROVIDER_SITE_OTHER): Payer: BC Managed Care – PPO | Admitting: Nurse Practitioner

## 2022-01-09 VITALS — BP 150/77 | HR 72 | Resp 16 | Ht 67.0 in | Wt 380.8 lb

## 2022-01-09 DIAGNOSIS — I890 Lymphedema, not elsewhere classified: Secondary | ICD-10-CM | POA: Diagnosis not present

## 2022-01-09 NOTE — Progress Notes (Signed)
History of Present Illness  There is no documented history at this time  Assessments & Plan   There are no diagnoses linked to this encounter.    Additional instructions  Subjective:  Patient presents with venous ulcer of the Bilateral lower extremity.    Procedure:  3 layer unna wrap was placed Bilateral lower extremity.   Plan:   Follow up in one week.  

## 2022-01-16 ENCOUNTER — Ambulatory Visit (INDEPENDENT_AMBULATORY_CARE_PROVIDER_SITE_OTHER): Payer: BC Managed Care – PPO | Admitting: Nurse Practitioner

## 2022-01-16 ENCOUNTER — Encounter (INDEPENDENT_AMBULATORY_CARE_PROVIDER_SITE_OTHER): Payer: Self-pay | Admitting: Nurse Practitioner

## 2022-01-16 VITALS — BP 153/85 | HR 73 | Resp 16 | Wt 378.0 lb

## 2022-01-16 DIAGNOSIS — J4520 Mild intermittent asthma, uncomplicated: Secondary | ICD-10-CM | POA: Diagnosis not present

## 2022-01-16 DIAGNOSIS — I890 Lymphedema, not elsewhere classified: Secondary | ICD-10-CM

## 2022-01-19 ENCOUNTER — Encounter (INDEPENDENT_AMBULATORY_CARE_PROVIDER_SITE_OTHER): Payer: Self-pay | Admitting: Nurse Practitioner

## 2022-01-23 ENCOUNTER — Encounter (INDEPENDENT_AMBULATORY_CARE_PROVIDER_SITE_OTHER): Payer: Self-pay

## 2022-01-23 ENCOUNTER — Ambulatory Visit (INDEPENDENT_AMBULATORY_CARE_PROVIDER_SITE_OTHER): Payer: BC Managed Care – PPO | Admitting: Nurse Practitioner

## 2022-01-23 VITALS — BP 151/85 | HR 66 | Resp 17 | Wt 381.0 lb

## 2022-01-23 DIAGNOSIS — I890 Lymphedema, not elsewhere classified: Secondary | ICD-10-CM

## 2022-01-23 NOTE — Progress Notes (Signed)
History of Present Illness  There is no documented history at this time  Assessments & Plan   There are no diagnoses linked to this encounter.    Additional instructions  Subjective:  Patient presents with venous ulcer of the Bilateral lower extremity.    Procedure:  3 layer unna wrap was placed Bilateral lower extremity.   Plan:   Follow up in one week.  

## 2022-01-27 ENCOUNTER — Encounter (INDEPENDENT_AMBULATORY_CARE_PROVIDER_SITE_OTHER): Payer: Self-pay | Admitting: Nurse Practitioner

## 2022-01-27 NOTE — Progress Notes (Signed)
? ?Subjective:  ? ? Patient ID: Joanna Nielsen, female    DOB: 1960-11-17, 61 y.o.   MRN: [id number] ?Chief Complaint  ?Patient presents with  ? Follow-up  ?  Unna wrap follow up  ? ? ?The patient presents today for follow-up after being in wraps for several weeks.  She notes that she had some issues with the right lower extremity and wrapping the wrap fell down.  She has no evidence of cellulitis in this area.  However the weeping that was happening in the right lower extremity has resolved. ? ? ?Review of Systems  ?Cardiovascular:  Positive for leg swelling.  ?All other systems reviewed and are negative. ? ?   ?Objective:  ? Physical Exam ?Vitals reviewed.  ?Constitutional:   ?   Appearance: She is obese.  ?HENT:  ?   Head: Normocephalic.  ?Cardiovascular:  ?   Rate and Rhythm: Normal rate.  ?   Pulses: Normal pulses.  ?Pulmonary:  ?   Effort: Pulmonary effort is normal.  ?Musculoskeletal:  ?   Right lower leg: Edema present.  ?   Left lower leg: Edema present.  ?Skin: ?   General: Skin is warm and dry.  ?Neurological:  ?   Mental Status: She is alert and oriented to person, place, and time.  ?Psychiatric:     ?   Mood and Affect: Mood normal.     ?   Behavior: Behavior normal.     ?   Thought Content: Thought content normal.     ?   Judgment: Judgment normal.  ? ? ?BP (!) 153/85 (BP Location: Right Arm)   Pulse 73   Resp 16   Wt (!) 378 lb (171.5 kg)   BMI 59.20 kg/m?  ? ?Past Medical History:  ?Diagnosis Date  ? Irregular heartbeat   ? ? ?Social History  ? ?Socioeconomic History  ? Marital status: Married  ?  Spouse name: Not on file  ? Number of children: Not on file  ? Years of education: Not on file  ? Highest education level: Not on file  ?Occupational History  ? Not on file  ?Tobacco Use  ? Smoking status: Never  ? Smokeless tobacco: Never  ?Substance and Sexual Activity  ? Alcohol use: No  ? Drug use: Never  ? Sexual activity: Not on file  ?Other Topics Concern  ? Not on file  ?Social History Narrative   ? Not on file  ? ?Social Determinants of Health  ? ?Financial Resource Strain: Not on file  ?Food Insecurity: Not on file  ?Transportation Needs: Not on file  ?Physical Activity: Not on file  ?Stress: Not on file  ?Social Connections: Not on file  ?Intimate Partner Violence: Not on file  ? ? ?Past Surgical History:  ?Procedure Laterality Date  ? ABDOMINAL HYSTERECTOMY    ? FRACTURE SURGERY    ? ? ?Family History  ?Problem Relation Age of Onset  ? Pancreatic cancer Father   ? Diabetes Sister   ? Anxiety disorder Brother   ? Diabetes Maternal Grandfather   ? ? ?No Known Allergies ? ?   ? View : No data to display.  ?  ?  ?  ? ? ? ? ?CMP  ?No results found for: NA, K, CL, CO2, GLUCOSE, BUN, CREATININE, CALCIUM, PROT, ALBUMIN, AST, ALT, ALKPHOS, BILITOT, GFRNONAA, GFRAA ? ? ?No results found. ? ?   ?Assessment & Plan:  ? ?1. Lymphedema ?The patient continues to have  issues with swelling. She will continue with the wraps for 4 weeks to help gain control swelling.  Patient is also advised to continue with elevation and activity. ?  ? ?2. Asthma, stable, mild intermittent ?Continue pulmonary medications and aerosols as already ordered, these medications have been reviewed and there are no changes at this time. ?  ? ? ?Current Outpatient Medications on File Prior to Visit  ?Medication Sig Dispense Refill  ? aspirin-acetaminophen-caffeine (EXCEDRIN MIGRAINE) 250-250-65 MG tablet Take by mouth every 6 (six) hours as needed for headache.    ? furosemide (LASIX) 20 MG tablet Take 20 mg by mouth.    ? ibuprofen (ADVIL) 200 MG tablet Take 200 mg by mouth every 6 (six) hours as needed.    ? ALPRAZolam (XANAX) 0.5 MG tablet Take 0.5 mg by mouth 2 (two) times daily as needed. (Patient not taking: Reported on 01/16/2022)    ? escitalopram (LEXAPRO) 10 MG tablet Take 10 mg by mouth daily. (Patient not taking: Reported on 01/16/2022)    ? sulfamethoxazole-trimethoprim (BACTRIM DS) 800-160 MG tablet Take 1 tablet by mouth 2 (two) times  daily. (Patient not taking: Reported on 01/16/2022) 14 tablet 0  ? ?No current facility-administered medications on file prior to visit.  ? ? ?There are no Patient Instructions on file for this visit. ?No follow-ups on file. ? ? ?Kris Hartmann, NP ? ? ?

## 2022-01-30 ENCOUNTER — Encounter (INDEPENDENT_AMBULATORY_CARE_PROVIDER_SITE_OTHER): Payer: Self-pay | Admitting: Nurse Practitioner

## 2022-01-30 ENCOUNTER — Ambulatory Visit (INDEPENDENT_AMBULATORY_CARE_PROVIDER_SITE_OTHER): Payer: BC Managed Care – PPO | Admitting: Nurse Practitioner

## 2022-01-30 VITALS — BP 131/86 | HR 71 | Resp 16 | Wt 382.4 lb

## 2022-01-30 DIAGNOSIS — I890 Lymphedema, not elsewhere classified: Secondary | ICD-10-CM | POA: Diagnosis not present

## 2022-01-30 NOTE — Progress Notes (Signed)
History of Present Illness  There is no documented history at this time  Assessments & Plan   There are no diagnoses linked to this encounter.    Additional instructions  Subjective:  Patient presents with venous ulcer of the Bilateral lower extremity.    Procedure:  3 layer unna wrap was placed Bilateral lower extremity.   Plan:   Follow up in one week.  

## 2022-02-03 ENCOUNTER — Encounter (INDEPENDENT_AMBULATORY_CARE_PROVIDER_SITE_OTHER): Payer: Self-pay | Admitting: Nurse Practitioner

## 2022-02-06 ENCOUNTER — Encounter (INDEPENDENT_AMBULATORY_CARE_PROVIDER_SITE_OTHER): Payer: Self-pay

## 2022-02-06 ENCOUNTER — Ambulatory Visit (INDEPENDENT_AMBULATORY_CARE_PROVIDER_SITE_OTHER): Payer: BC Managed Care – PPO | Admitting: Nurse Practitioner

## 2022-02-06 ENCOUNTER — Encounter (INDEPENDENT_AMBULATORY_CARE_PROVIDER_SITE_OTHER): Payer: BC Managed Care – PPO

## 2022-02-06 VITALS — BP 153/82 | HR 94 | Resp 18 | Ht 69.0 in | Wt 382.2 lb

## 2022-02-06 DIAGNOSIS — L97919 Non-pressure chronic ulcer of unspecified part of right lower leg with unspecified severity: Secondary | ICD-10-CM

## 2022-02-06 DIAGNOSIS — L97929 Non-pressure chronic ulcer of unspecified part of left lower leg with unspecified severity: Secondary | ICD-10-CM

## 2022-02-06 DIAGNOSIS — I890 Lymphedema, not elsewhere classified: Secondary | ICD-10-CM | POA: Diagnosis not present

## 2022-02-06 NOTE — Progress Notes (Signed)
History of Present Illness  There is no documented history at this time  Assessments & Plan   There are no diagnoses linked to this encounter.    Additional instructions  Subjective:  Patient presents with venous ulcer of the Bilateral lower extremity.    Procedure:  3 layer unna wrap was placed Bilateral lower extremity.   Plan:   Follow up in one week.  

## 2022-02-10 ENCOUNTER — Encounter (INDEPENDENT_AMBULATORY_CARE_PROVIDER_SITE_OTHER): Payer: Self-pay | Admitting: Nurse Practitioner

## 2022-02-13 ENCOUNTER — Ambulatory Visit (INDEPENDENT_AMBULATORY_CARE_PROVIDER_SITE_OTHER): Payer: BC Managed Care – PPO | Admitting: Nurse Practitioner

## 2022-02-13 ENCOUNTER — Encounter (INDEPENDENT_AMBULATORY_CARE_PROVIDER_SITE_OTHER): Payer: Self-pay

## 2022-02-13 VITALS — BP 146/87 | HR 75 | Resp 16 | Wt 386.0 lb

## 2022-02-13 DIAGNOSIS — I890 Lymphedema, not elsewhere classified: Secondary | ICD-10-CM

## 2022-02-13 NOTE — Progress Notes (Signed)
History of Present Illness  There is no documented history at this time  Assessments & Plan   There are no diagnoses linked to this encounter.    Additional instructions  Subjective:  Patient presents with venous ulcer of the Bilateral lower extremity.    Procedure:  3 layer unna wrap was placed Bilateral lower extremity.   Plan:   Follow up in one week.  

## 2022-02-16 ENCOUNTER — Encounter (INDEPENDENT_AMBULATORY_CARE_PROVIDER_SITE_OTHER): Payer: Self-pay | Admitting: Nurse Practitioner

## 2022-02-20 ENCOUNTER — Encounter (INDEPENDENT_AMBULATORY_CARE_PROVIDER_SITE_OTHER): Payer: BC Managed Care – PPO

## 2022-02-21 ENCOUNTER — Ambulatory Visit (INDEPENDENT_AMBULATORY_CARE_PROVIDER_SITE_OTHER): Payer: BC Managed Care – PPO | Admitting: Nurse Practitioner

## 2022-02-21 ENCOUNTER — Encounter (INDEPENDENT_AMBULATORY_CARE_PROVIDER_SITE_OTHER): Payer: Self-pay

## 2022-02-21 VITALS — BP 152/79 | HR 70 | Resp 16 | Wt 382.4 lb

## 2022-02-21 DIAGNOSIS — I890 Lymphedema, not elsewhere classified: Secondary | ICD-10-CM | POA: Diagnosis not present

## 2022-02-21 NOTE — Progress Notes (Signed)
History of Present Illness  There is no documented history at this time  Assessments & Plan   There are no diagnoses linked to this encounter.    Additional instructions  Subjective:  Patient presents with venous ulcer of the Bilateral lower extremity.    Procedure:  3 layer unna wrap was placed Bilateral lower extremity.   Plan:   Follow up in one week.  

## 2022-02-23 ENCOUNTER — Encounter (INDEPENDENT_AMBULATORY_CARE_PROVIDER_SITE_OTHER): Payer: Self-pay | Admitting: Nurse Practitioner

## 2022-02-27 ENCOUNTER — Ambulatory Visit (INDEPENDENT_AMBULATORY_CARE_PROVIDER_SITE_OTHER): Payer: BC Managed Care – PPO | Admitting: Nurse Practitioner

## 2022-02-27 ENCOUNTER — Encounter (INDEPENDENT_AMBULATORY_CARE_PROVIDER_SITE_OTHER): Payer: Self-pay | Admitting: Nurse Practitioner

## 2022-02-27 VITALS — BP 137/86 | HR 73 | Resp 16 | Wt 385.8 lb

## 2022-02-27 DIAGNOSIS — I890 Lymphedema, not elsewhere classified: Secondary | ICD-10-CM | POA: Diagnosis not present

## 2022-02-27 DIAGNOSIS — J4520 Mild intermittent asthma, uncomplicated: Secondary | ICD-10-CM | POA: Diagnosis not present

## 2022-03-06 ENCOUNTER — Encounter (INDEPENDENT_AMBULATORY_CARE_PROVIDER_SITE_OTHER): Payer: Self-pay

## 2022-03-06 ENCOUNTER — Ambulatory Visit (INDEPENDENT_AMBULATORY_CARE_PROVIDER_SITE_OTHER): Payer: BC Managed Care – PPO | Admitting: Nurse Practitioner

## 2022-03-06 VITALS — BP 194/68 | HR 87 | Resp 17

## 2022-03-06 DIAGNOSIS — I890 Lymphedema, not elsewhere classified: Secondary | ICD-10-CM | POA: Diagnosis not present

## 2022-03-06 NOTE — Progress Notes (Signed)
History of Present Illness  There is no documented history at this time  Assessments & Plan   There are no diagnoses linked to this encounter.    Additional instructions  Subjective:  Patient presents with venous ulcer of the Bilateral lower extremity.    Procedure:  3 layer unna wrap was placed Bilateral lower extremity.   Plan:   Follow up in one week.  

## 2022-03-06 NOTE — Progress Notes (Signed)
Subjective:    Patient ID: Joanna Nielsen, female    DOB: August 27, 1961, 61 y.o.   MRN: [id number] Chief Complaint  Patient presents with   Follow-up    Unna wrap follow up     Patient follows up today and return after several weeks in Unna wraps.  The patient notes that the swelling is worse that she has been more active recently.  The patient does have a lymphedema pump but she continues to utilize it.  There is no evidence of cellulitis today.  No weeping or open ulcerations.   Review of Systems  Cardiovascular:  Positive for leg swelling.  All other systems reviewed and are negative.     Objective:   Physical Exam Vitals reviewed.  Constitutional:      Appearance: She is obese.  HENT:     Head: Normocephalic.  Cardiovascular:     Rate and Rhythm: Normal rate.  Pulmonary:     Effort: Pulmonary effort is normal.  Skin:    General: Skin is warm and dry.  Neurological:     Mental Status: She is alert and oriented to person, place, and time.  Psychiatric:        Mood and Affect: Mood normal.        Behavior: Behavior normal.        Thought Content: Thought content normal.        Judgment: Judgment normal.    BP 137/86 (BP Location: Left Arm)   Pulse 73   Resp 16   Wt (!) 385 lb 12.8 oz (175 kg)   BMI 56.97 kg/m   Past Medical History:  Diagnosis Date   Irregular heartbeat     Social History   Socioeconomic History   Marital status: Married    Spouse name: Not on file   Number of children: Not on file   Years of education: Not on file   Highest education level: Not on file  Occupational History   Not on file  Tobacco Use   Smoking status: Never   Smokeless tobacco: Never  Substance and Sexual Activity   Alcohol use: No   Drug use: Never   Sexual activity: Not on file  Other Topics Concern   Not on file  Social History Narrative   Not on file   Social Determinants of Health   Financial Resource Strain: Not on file  Food Insecurity: Not on file   Transportation Needs: Not on file  Physical Activity: Not on file  Stress: Not on file  Social Connections: Not on file  Intimate Partner Violence: Not on file    Past Surgical History:  Procedure Laterality Date   ABDOMINAL HYSTERECTOMY     FRACTURE SURGERY      Family History  Problem Relation Age of Onset   Pancreatic cancer Father    Diabetes Sister    Anxiety disorder Brother    Diabetes Maternal Grandfather     No Known Allergies      View : No data to display.            CMP  No results found for: NA, K, CL, CO2, GLUCOSE, BUN, CREATININE, CALCIUM, PROT, ALBUMIN, AST, ALT, ALKPHOS, BILITOT, GFRNONAA, GFRAA   No results found.     Assessment & Plan:   1. Lymphedema Patient still continues to have significant issues with swelling.  Previously with the patient has had worsening swelling, with removal Unna wraps she has had weeping and ulcerations that followed  shortly thereafter.  Due to her worsening swelling we will keep the patient in wraps.  She is advised to continue to utilize her lymphedema pump daily. The patient return in 4 weeks for evaluation with weekly wrap changes  2. Asthma, stable, mild intermittent Continue pulmonary medications and aerosols as already ordered, these medications have been reviewed and there are no changes at this time.     Current Outpatient Medications on File Prior to Visit  Medication Sig Dispense Refill   aspirin-acetaminophen-caffeine (EXCEDRIN MIGRAINE) 250-250-65 MG tablet Take by mouth every 6 (six) hours as needed for headache.     furosemide (LASIX) 20 MG tablet Take 20 mg by mouth.     ibuprofen (ADVIL) 200 MG tablet Take 200 mg by mouth every 6 (six) hours as needed.     ALPRAZolam (XANAX) 0.5 MG tablet Take 0.5 mg by mouth 2 (two) times daily as needed. (Patient not taking: Reported on 01/16/2022)     escitalopram (LEXAPRO) 10 MG tablet Take 10 mg by mouth daily. (Patient not taking: Reported on 01/16/2022)      No current facility-administered medications on file prior to visit.    There are no Patient Instructions on file for this visit. No follow-ups on file.   [name], NP

## 2022-03-07 ENCOUNTER — Encounter (INDEPENDENT_AMBULATORY_CARE_PROVIDER_SITE_OTHER): Payer: Self-pay | Admitting: Nurse Practitioner

## 2022-03-11 ENCOUNTER — Encounter (INDEPENDENT_AMBULATORY_CARE_PROVIDER_SITE_OTHER): Payer: Self-pay | Admitting: Nurse Practitioner

## 2022-03-13 ENCOUNTER — Ambulatory Visit (INDEPENDENT_AMBULATORY_CARE_PROVIDER_SITE_OTHER): Payer: BC Managed Care – PPO | Admitting: Nurse Practitioner

## 2022-03-13 ENCOUNTER — Encounter (INDEPENDENT_AMBULATORY_CARE_PROVIDER_SITE_OTHER): Payer: Self-pay | Admitting: Nurse Practitioner

## 2022-03-13 VITALS — BP 151/85 | HR 84 | Resp 16 | Wt 383.0 lb

## 2022-03-13 DIAGNOSIS — I890 Lymphedema, not elsewhere classified: Secondary | ICD-10-CM

## 2022-03-13 NOTE — Progress Notes (Signed)
History of Present Illness  There is no documented history at this time  Assessments & Plan   There are no diagnoses linked to this encounter.    Additional instructions  Subjective:  Patient presents with venous ulcer of the Bilateral lower extremity.    Procedure:  3 layer unna wrap was placed Bilateral lower extremity.   Plan:   Follow up in one week.  

## 2022-03-18 ENCOUNTER — Encounter (INDEPENDENT_AMBULATORY_CARE_PROVIDER_SITE_OTHER): Payer: Self-pay | Admitting: Nurse Practitioner

## 2022-03-20 ENCOUNTER — Ambulatory Visit (INDEPENDENT_AMBULATORY_CARE_PROVIDER_SITE_OTHER): Payer: BC Managed Care – PPO | Admitting: Nurse Practitioner

## 2022-03-20 ENCOUNTER — Encounter (INDEPENDENT_AMBULATORY_CARE_PROVIDER_SITE_OTHER): Payer: Self-pay

## 2022-03-20 VITALS — BP 147/84 | HR 72 | Resp 17 | Ht 71.0 in | Wt 387.0 lb

## 2022-03-20 DIAGNOSIS — I890 Lymphedema, not elsewhere classified: Secondary | ICD-10-CM | POA: Diagnosis not present

## 2022-03-20 NOTE — Progress Notes (Signed)
History of Present Illness  There is no documented history at this time  Assessments & Plan   There are no diagnoses linked to this encounter.    Additional instructions  Subjective:  Patient presents with venous ulcer of the Bilateral lower extremity.    Procedure:  3 layer unna wrap was placed Bilateral lower extremity.   Plan:   Follow up in one week.  

## 2022-03-22 ENCOUNTER — Encounter (INDEPENDENT_AMBULATORY_CARE_PROVIDER_SITE_OTHER): Payer: BC Managed Care – PPO

## 2022-03-25 ENCOUNTER — Encounter (INDEPENDENT_AMBULATORY_CARE_PROVIDER_SITE_OTHER): Payer: Self-pay | Admitting: Nurse Practitioner

## 2022-03-27 ENCOUNTER — Encounter (INDEPENDENT_AMBULATORY_CARE_PROVIDER_SITE_OTHER): Payer: Self-pay

## 2022-03-27 ENCOUNTER — Ambulatory Visit (INDEPENDENT_AMBULATORY_CARE_PROVIDER_SITE_OTHER): Payer: BC Managed Care – PPO | Admitting: Nurse Practitioner

## 2022-03-27 VITALS — BP 157/91 | HR 77 | Resp 16 | Wt 387.8 lb

## 2022-03-27 DIAGNOSIS — I890 Lymphedema, not elsewhere classified: Secondary | ICD-10-CM | POA: Diagnosis not present

## 2022-03-27 NOTE — Progress Notes (Unsigned)
History of Present Illness  There is no documented history at this time  Assessments & Plan   There are no diagnoses linked to this encounter.    Additional instructions  Subjective:  Patient presents with venous ulcer of the Bilateral lower extremity.    Procedure:  3 layer unna wrap was placed Bilateral lower extremity.   Plan:   Follow up in one week.  

## 2022-04-04 ENCOUNTER — Encounter (INDEPENDENT_AMBULATORY_CARE_PROVIDER_SITE_OTHER): Payer: Self-pay | Admitting: Nurse Practitioner

## 2022-04-04 ENCOUNTER — Ambulatory Visit (INDEPENDENT_AMBULATORY_CARE_PROVIDER_SITE_OTHER): Payer: BC Managed Care – PPO | Admitting: Nurse Practitioner

## 2022-04-04 VITALS — BP 148/76 | HR 81 | Resp 17 | Ht 71.0 in | Wt 386.0 lb

## 2022-04-04 DIAGNOSIS — I890 Lymphedema, not elsewhere classified: Secondary | ICD-10-CM | POA: Diagnosis not present

## 2022-04-07 ENCOUNTER — Encounter (INDEPENDENT_AMBULATORY_CARE_PROVIDER_SITE_OTHER): Payer: Self-pay | Admitting: Nurse Practitioner

## 2022-04-16 ENCOUNTER — Encounter (INDEPENDENT_AMBULATORY_CARE_PROVIDER_SITE_OTHER): Payer: Self-pay | Admitting: Nurse Practitioner

## 2022-04-16 NOTE — Progress Notes (Signed)
Subjective:    Patient ID: Joanna Nielsen, female    DOB: [birth year], 61 y.o.   MRN: [id number] No chief complaint on file.   The patient returns today for evaluation after several weeks of Unna wraps.  The swelling is much improved.  There are no open wounds or ulcerations.  The patient also notes that her legs feel much better compared to previous visits.    Review of Systems  Cardiovascular:  Positive for leg swelling.  All other systems reviewed and are negative.      Objective:   Physical Exam Vitals reviewed.  HENT:     Head: Normocephalic.  Cardiovascular:     Rate and Rhythm: Normal rate.     Pulses: Normal pulses.  Pulmonary:     Effort: Pulmonary effort is normal.  Musculoskeletal:     Right lower leg: Edema present.     Left lower leg: Edema present.  Skin:    General: Skin is warm and dry.  Neurological:     Mental Status: She is alert and oriented to person, place, and time.  Psychiatric:        Mood and Affect: Mood normal.        Behavior: Behavior normal.        Thought Content: Thought content normal.        Judgment: Judgment normal.     BP (!) 148/76 (BP Location: Right Arm)   Pulse 81   Resp 17   Ht 5\' 11"  (1.803 m)   Wt (!) 386 lb (175.1 kg)   BMI 53.84 kg/m   Past Medical History:  Diagnosis Date   Irregular heartbeat     Social History   Socioeconomic History   Marital status: Married    Spouse name: Not on file   Number of children: Not on file   Years of education: Not on file   Highest education level: Not on file  Occupational History   Not on file  Tobacco Use   Smoking status: Never   Smokeless tobacco: Never  Substance and Sexual Activity   Alcohol use: No   Drug use: Never   Sexual activity: Not on file  Other Topics Concern   Not on file  Social History Narrative   Not on file   Social Determinants of Health   Financial Resource Strain: Not on file  Food Insecurity: Not on file  Transportation Needs:  Not on file  Physical Activity: Not on file  Stress: Not on file  Social Connections: Not on file  Intimate Partner Violence: Not on file    Past Surgical History:  Procedure Laterality Date   ABDOMINAL HYSTERECTOMY     FRACTURE SURGERY      Family History  Problem Relation Age of Onset   Pancreatic cancer Father    Diabetes Sister    Anxiety disorder Brother    Diabetes Maternal Grandfather     No Known Allergies      No data to display            CMP  No results found for: "NA", "K", "CL", "CO2", "GLUCOSE", "BUN", "CREATININE", "CALCIUM", "PROT", "ALBUMIN", "AST", "ALT", "ALKPHOS", "BILITOT", "GFRNONAA", "GFRAA"   No results found.     Assessment & Plan:   1. Lymphedema Lower extremity edema is much improved.  She is going on a vacation and will be standing for extended periods.  We will replace the patient Unna boots today but she will remove when she  returns home.  We will have the patient return in 6 weeks to reevaluate.   Current Outpatient Medications on File Prior to Visit  Medication Sig Dispense Refill   ibuprofen (ADVIL) 200 MG tablet Take 200 mg by mouth every 6 (six) hours as needed.     ALPRAZolam (XANAX) 0.5 MG tablet Take 0.5 mg by mouth 2 (two) times daily as needed. (Patient not taking: Reported on 01/16/2022)     aspirin-acetaminophen-caffeine (EXCEDRIN MIGRAINE) 250-250-65 MG tablet Take by mouth every 6 (six) hours as needed for headache. (Patient not taking: Reported on 04/04/2022)     escitalopram (LEXAPRO) 10 MG tablet Take 10 mg by mouth daily. (Patient not taking: Reported on 01/16/2022)     furosemide (LASIX) 20 MG tablet Take 20 mg by mouth. (Patient not taking: Reported on 04/04/2022)     No current facility-administered medications on file prior to visit.    There are no Patient Instructions on file for this visit. No follow-ups on file.   [name], NP

## 2022-04-22 DIAGNOSIS — R7303 Prediabetes: Secondary | ICD-10-CM | POA: Diagnosis not present

## 2022-04-22 DIAGNOSIS — R8290 Unspecified abnormal findings in urine: Secondary | ICD-10-CM | POA: Diagnosis not present

## 2022-04-22 DIAGNOSIS — Z13220 Encounter for screening for lipoid disorders: Secondary | ICD-10-CM | POA: Diagnosis not present

## 2022-04-22 DIAGNOSIS — Z6841 Body Mass Index (BMI) 40.0 and over, adult: Secondary | ICD-10-CM | POA: Diagnosis not present

## 2022-04-22 DIAGNOSIS — R350 Frequency of micturition: Secondary | ICD-10-CM | POA: Diagnosis not present

## 2022-04-22 DIAGNOSIS — M545 Low back pain, unspecified: Secondary | ICD-10-CM | POA: Diagnosis not present

## 2022-04-29 ENCOUNTER — Telehealth (INDEPENDENT_AMBULATORY_CARE_PROVIDER_SITE_OTHER): Payer: Self-pay | Admitting: Nurse Practitioner

## 2022-04-29 DIAGNOSIS — I878 Other specified disorders of veins: Secondary | ICD-10-CM | POA: Diagnosis not present

## 2022-04-29 DIAGNOSIS — R600 Localized edema: Secondary | ICD-10-CM | POA: Diagnosis not present

## 2022-04-29 DIAGNOSIS — R7303 Prediabetes: Secondary | ICD-10-CM | POA: Diagnosis not present

## 2022-04-29 IMAGING — CT CT HEAD W/O CM
3 series · 15 of 47 positions shown, 18 images · non-contrast
Comparison: No comparison studies available.

CLINICAL DATA: Tripped and fell at work.  Scalp laceration.

EXAM:
CT HEAD WITHOUT CONTRAST
TECHNIQUE: Contiguous axial images were obtained from the base of the skull
through the vertex without intravenous contrast.

[Series 2: head wo · axial · 0.48mm/px · z∈[-73,+62]mm · 9 of 33 slices shown, 12 images]
[im 3/33  brain]
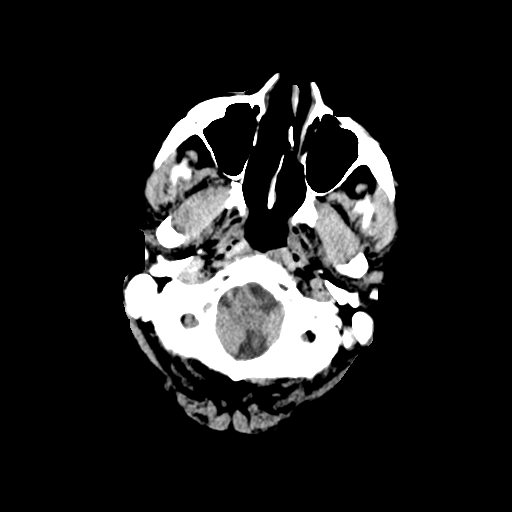
[im 3/33  bone]
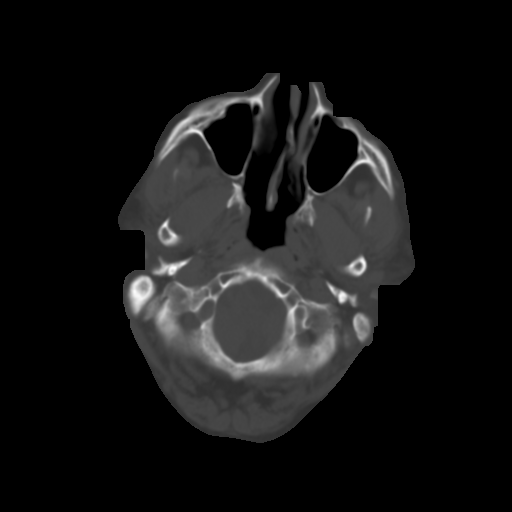
[im 6/33  brain]
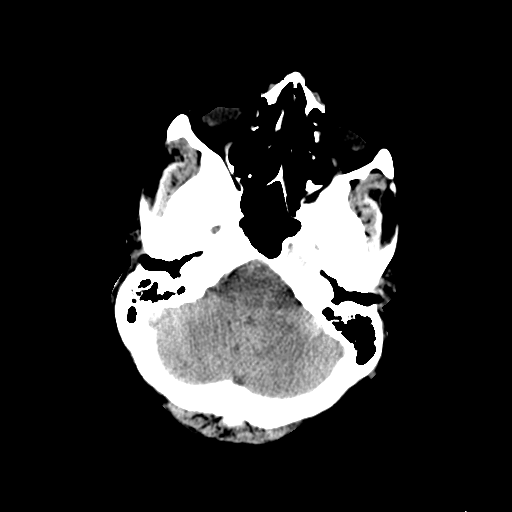
[im 9/33  brain]
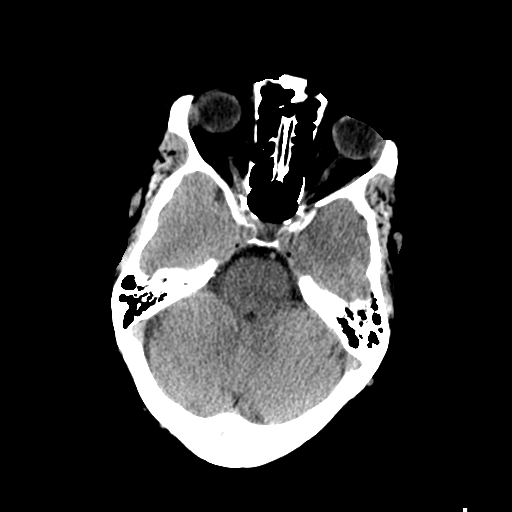
[im 13/33  brain]
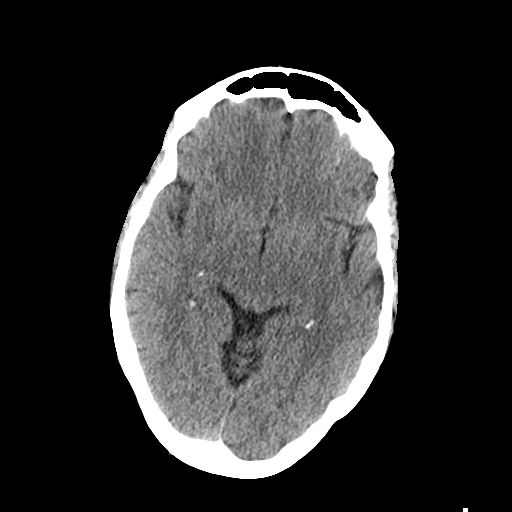
[im 17/33  brain]
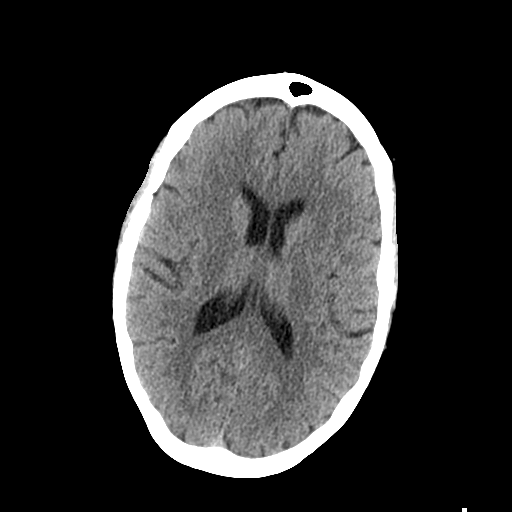
[im 17/33  bone]
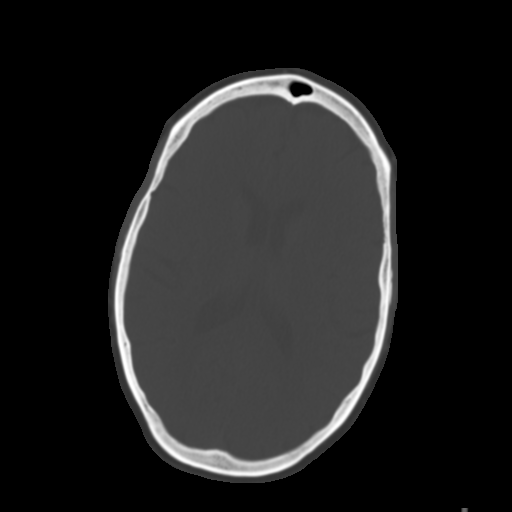
[im 20/33  brain]
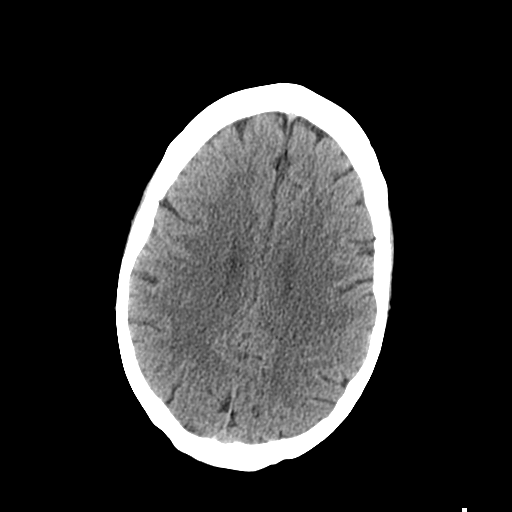
[im 24/33  brain]
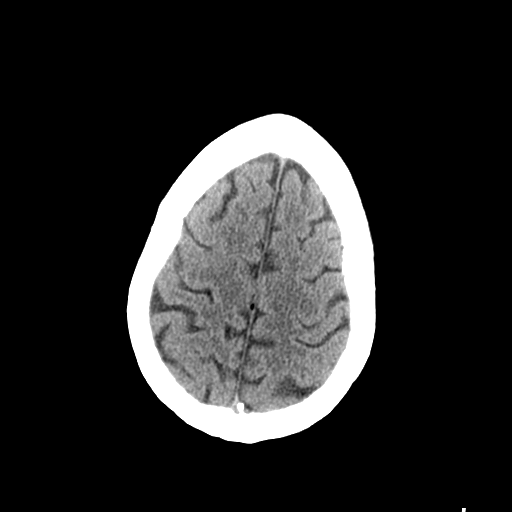
[im 27/33  brain]
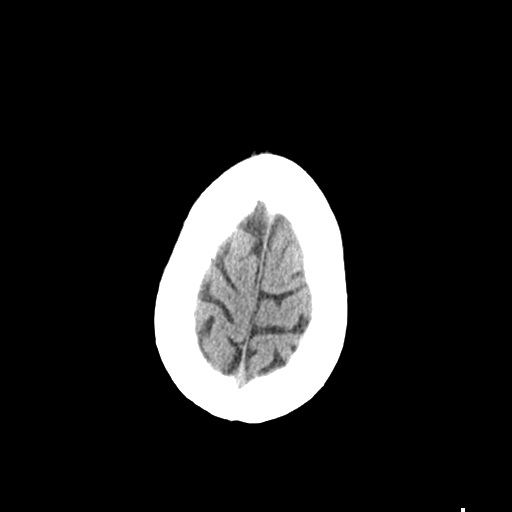
[im 30/33  brain]
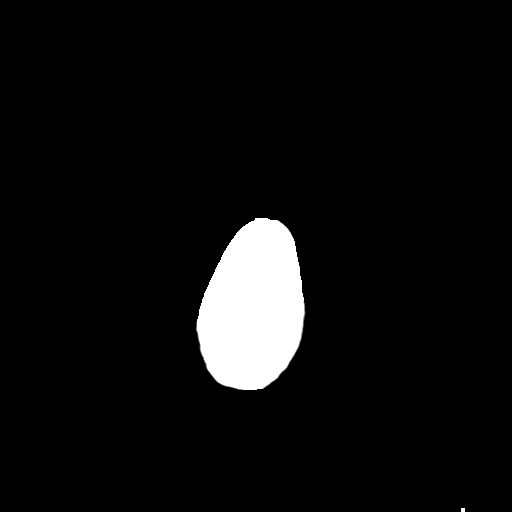
[im 30/33  bone]
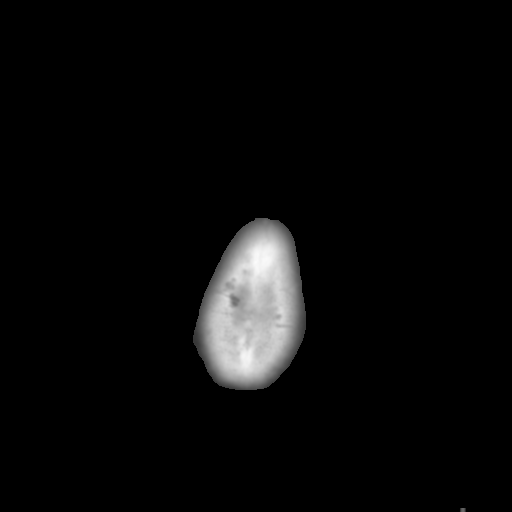

[Series 4: coronal soft tissue · coronal · 0.33mm/px · 3 of 70 slices shown]
[im 24/70  brain]
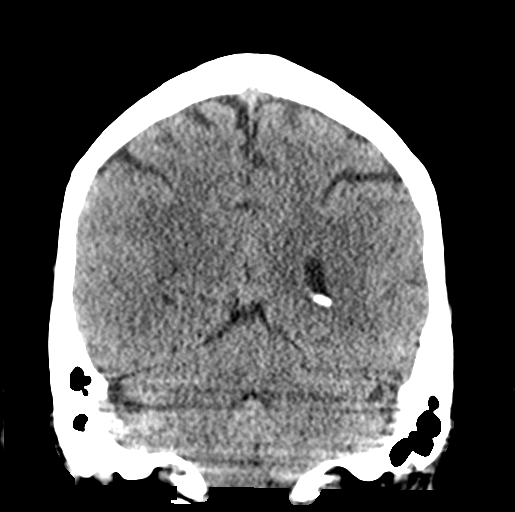
[im 31/70  brain]
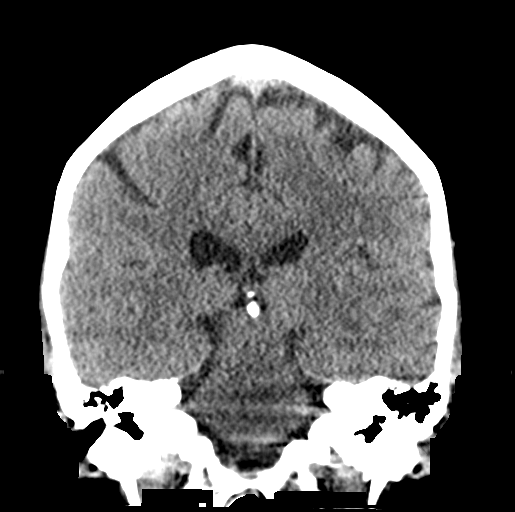
[im 39/70  brain]
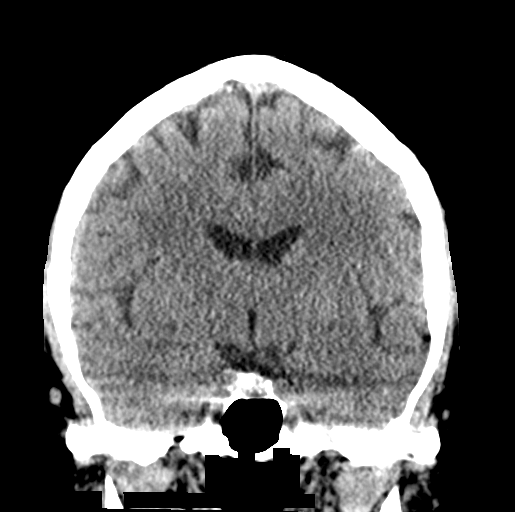

[Series 5: sagittal soft tissue · sagittal · 0.31mm/px · 3 of 50 slices shown]
[im 17/50  brain]
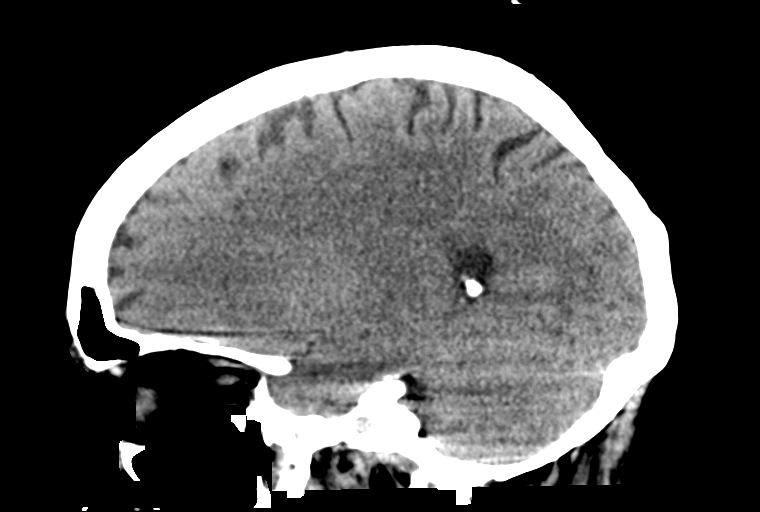
[im 25/50  brain]
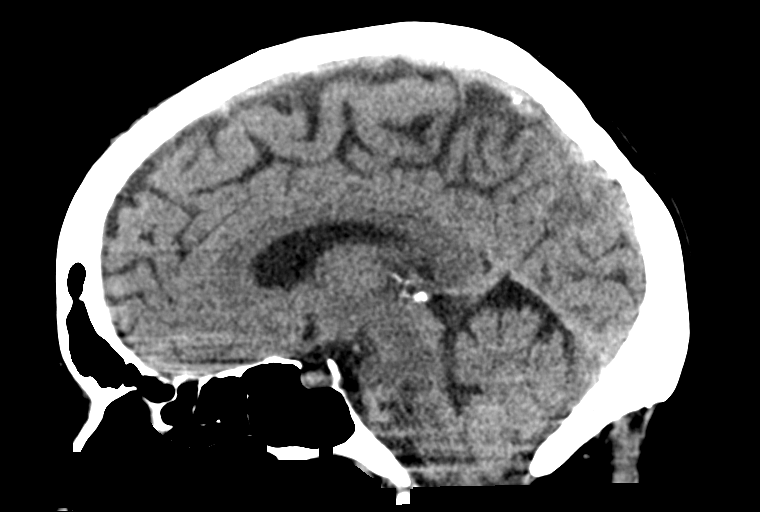
[im 33/50  brain]
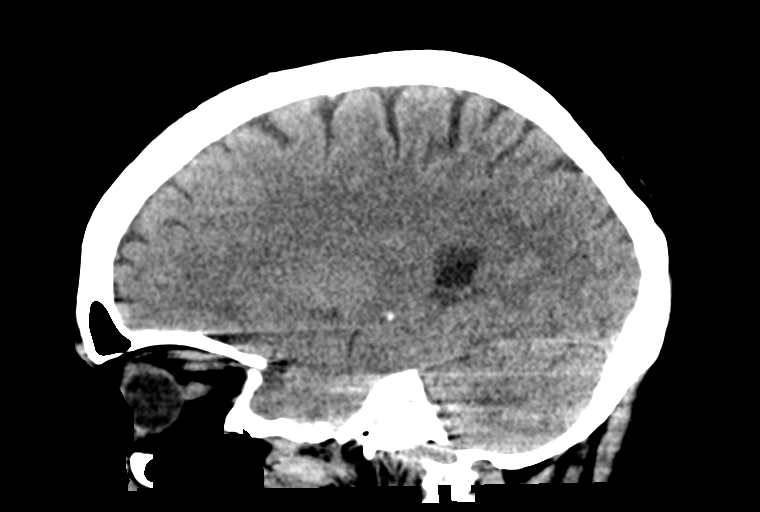

[15 of 47 positions shown; findings below may reference images not displayed]

FINDINGS: Brain: There is no evidence for acute hemorrhage, hydrocephalus,
mass lesion, or abnormal extra-axial fluid collection. No definite
CT evidence for acute infarction.

Vascular: No hyperdense vessel or unexpected calcification.

Skull: No evidence for fracture. No worrisome lytic or sclerotic
lesion.

Sinuses/Orbits: The visualized paranasal sinuses and mastoid air
cells are clear. Visualized portions of the globes and intraorbital
fat are unremarkable.

Other: High frontal scalp laceration evident.
IMPRESSION: 1. No acute intracranial abnormality.
2. High frontal scalp laceration.

## 2022-04-29 NOTE — Telephone Encounter (Signed)
Dr. Merlinda Frederick is wanting patient to come back in for wraps. Pt states that V[facility] Birmingham wanted her out for 6 weeks. Patient states swelling and right leg has a lot of blisters - a lot more than normal.

## 2022-04-29 NOTE — Telephone Encounter (Signed)
Bring patient in for unna wraps  

## 2022-04-30 NOTE — Telephone Encounter (Signed)
Pt is scheduled - will need to see the length of wraps and schedule accordingly.

## 2022-05-01 ENCOUNTER — Ambulatory Visit (INDEPENDENT_AMBULATORY_CARE_PROVIDER_SITE_OTHER): Payer: BC Managed Care – PPO | Admitting: Nurse Practitioner

## 2022-05-01 ENCOUNTER — Encounter (INDEPENDENT_AMBULATORY_CARE_PROVIDER_SITE_OTHER): Payer: Self-pay

## 2022-05-01 VITALS — BP 151/82 | HR 76 | Resp 16 | Wt 385.0 lb

## 2022-05-01 DIAGNOSIS — I890 Lymphedema, not elsewhere classified: Secondary | ICD-10-CM

## 2022-05-01 NOTE — Progress Notes (Signed)
History of Present Illness  There is no documented history at this time  Assessments & Plan   There are no diagnoses linked to this encounter.    Additional instructions  Subjective:  Patient presents with venous ulcer of the Bilateral lower extremity.    Procedure:  3 layer unna wrap was placed Bilateral lower extremity.   Plan:   Follow up in one week.  

## 2022-05-06 DIAGNOSIS — I10 Essential (primary) hypertension: Secondary | ICD-10-CM | POA: Diagnosis not present

## 2022-05-06 DIAGNOSIS — J3489 Other specified disorders of nose and nasal sinuses: Secondary | ICD-10-CM | POA: Diagnosis not present

## 2022-05-06 DIAGNOSIS — R040 Epistaxis: Secondary | ICD-10-CM | POA: Diagnosis not present

## 2022-05-08 ENCOUNTER — Encounter (INDEPENDENT_AMBULATORY_CARE_PROVIDER_SITE_OTHER): Payer: Self-pay

## 2022-05-08 ENCOUNTER — Ambulatory Visit (INDEPENDENT_AMBULATORY_CARE_PROVIDER_SITE_OTHER): Payer: BC Managed Care – PPO | Admitting: Nurse Practitioner

## 2022-05-08 VITALS — BP 151/86 | HR 72 | Resp 16 | Wt 383.2 lb

## 2022-05-08 DIAGNOSIS — I890 Lymphedema, not elsewhere classified: Secondary | ICD-10-CM

## 2022-05-08 NOTE — Progress Notes (Signed)
History of Present Illness  There is no documented history at this time  Assessments & Plan   There are no diagnoses linked to this encounter.    Additional instructions  Subjective:  Patient presents with venous ulcer of the Bilateral lower extremity.    Procedure:  3 layer unna wrap was placed Bilateral lower extremity.   Plan:   Follow up in one week.  

## 2022-05-14 DIAGNOSIS — R040 Epistaxis: Secondary | ICD-10-CM | POA: Diagnosis not present

## 2022-05-14 DIAGNOSIS — J302 Other seasonal allergic rhinitis: Secondary | ICD-10-CM | POA: Diagnosis not present

## 2022-05-15 ENCOUNTER — Encounter (INDEPENDENT_AMBULATORY_CARE_PROVIDER_SITE_OTHER): Payer: BC Managed Care – PPO

## 2022-05-16 ENCOUNTER — Ambulatory Visit (INDEPENDENT_AMBULATORY_CARE_PROVIDER_SITE_OTHER): Payer: BC Managed Care – PPO | Admitting: Nurse Practitioner

## 2022-05-16 ENCOUNTER — Encounter (INDEPENDENT_AMBULATORY_CARE_PROVIDER_SITE_OTHER): Payer: Self-pay | Admitting: Nurse Practitioner

## 2022-05-16 VITALS — BP 139/84 | HR 85 | Resp 16 | Wt 383.4 lb

## 2022-05-16 DIAGNOSIS — I890 Lymphedema, not elsewhere classified: Secondary | ICD-10-CM | POA: Diagnosis not present

## 2022-05-16 NOTE — Progress Notes (Signed)
History of Present Illness  There is no documented history at this time  Assessments & Plan   There are no diagnoses linked to this encounter.    Additional instructions  Subjective:  Patient presents with venous ulcer of the Bilateral lower extremity.    Procedure:  3 layer unna wrap was placed Bilateral lower extremity.   Plan:   Follow up in one week.  

## 2022-05-22 ENCOUNTER — Ambulatory Visit (INDEPENDENT_AMBULATORY_CARE_PROVIDER_SITE_OTHER): Payer: BC Managed Care – PPO | Admitting: Nurse Practitioner

## 2022-05-22 ENCOUNTER — Encounter (INDEPENDENT_AMBULATORY_CARE_PROVIDER_SITE_OTHER): Payer: Self-pay | Admitting: Nurse Practitioner

## 2022-05-22 VITALS — BP 165/94 | HR 75 | Resp 16 | Wt 379.0 lb

## 2022-05-22 DIAGNOSIS — I890 Lymphedema, not elsewhere classified: Secondary | ICD-10-CM | POA: Diagnosis not present

## 2022-05-22 NOTE — Progress Notes (Signed)
History of Present Illness  There is no documented history at this time  Assessments & Plan   There are no diagnoses linked to this encounter.    Additional instructions  Subjective:  Patient presents with venous ulcer of the Bilateral lower extremity.    Procedure:  3 layer unna wrap was placed Bilateral lower extremity.   Plan:   Follow up in one week.  

## 2022-05-25 ENCOUNTER — Encounter (INDEPENDENT_AMBULATORY_CARE_PROVIDER_SITE_OTHER): Payer: Self-pay | Admitting: Nurse Practitioner

## 2022-05-29 ENCOUNTER — Ambulatory Visit (INDEPENDENT_AMBULATORY_CARE_PROVIDER_SITE_OTHER): Payer: BC Managed Care – PPO | Admitting: Nurse Practitioner

## 2022-05-29 ENCOUNTER — Encounter (INDEPENDENT_AMBULATORY_CARE_PROVIDER_SITE_OTHER): Payer: Self-pay

## 2022-05-29 VITALS — BP 156/85 | HR 84 | Resp 18 | Ht 71.0 in | Wt 380.8 lb

## 2022-05-29 DIAGNOSIS — I890 Lymphedema, not elsewhere classified: Secondary | ICD-10-CM | POA: Diagnosis not present

## 2022-05-29 NOTE — Progress Notes (Signed)
History of Present Illness  There is no documented history at this time  Assessments & Plan   There are no diagnoses linked to this encounter.    Additional instructions  Subjective:  Patient presents with venous ulcer of the Bilateral lower extremity.    Procedure:  3 layer unna wrap was placed Bilateral lower extremity.   Plan:   Follow up in one week.  

## 2022-06-02 ENCOUNTER — Encounter (INDEPENDENT_AMBULATORY_CARE_PROVIDER_SITE_OTHER): Payer: Self-pay | Admitting: Nurse Practitioner

## 2022-06-05 ENCOUNTER — Ambulatory Visit (INDEPENDENT_AMBULATORY_CARE_PROVIDER_SITE_OTHER): Payer: BC Managed Care – PPO | Admitting: Nurse Practitioner

## 2022-06-05 ENCOUNTER — Encounter (INDEPENDENT_AMBULATORY_CARE_PROVIDER_SITE_OTHER): Payer: Self-pay | Admitting: Nurse Practitioner

## 2022-06-05 VITALS — BP 152/88 | HR 80 | Resp 16 | Wt 379.8 lb

## 2022-06-05 DIAGNOSIS — Z6841 Body Mass Index (BMI) 40.0 and over, adult: Secondary | ICD-10-CM | POA: Diagnosis not present

## 2022-06-05 DIAGNOSIS — I890 Lymphedema, not elsewhere classified: Secondary | ICD-10-CM | POA: Diagnosis not present

## 2022-06-10 ENCOUNTER — Encounter (INDEPENDENT_AMBULATORY_CARE_PROVIDER_SITE_OTHER): Payer: Self-pay | Admitting: Nurse Practitioner

## 2022-06-23 ENCOUNTER — Encounter (INDEPENDENT_AMBULATORY_CARE_PROVIDER_SITE_OTHER): Payer: Self-pay | Admitting: Nurse Practitioner

## 2022-06-23 NOTE — Progress Notes (Signed)
Subjective:    Patient ID: Joanna Nielsen, female    DOB: 07-24-1961, 61 y.o.   MRN: [id number] Chief Complaint  Patient presents with   Follow-up    6 week unna wrap follow up    The patient returns today after several weeks of Unna wraps.  She has been able to obtain this specially order compression garments.  She is hopeful and optimistic that his garments will be helpful in controlling her compression.  Today she has no open wounds or ulcerations.    Review of Systems  Cardiovascular:  Positive for leg swelling.  All other systems reviewed and are negative.      Objective:   Physical Exam Vitals reviewed.  HENT:     Head: Normocephalic.  Cardiovascular:     Rate and Rhythm: Normal rate.  Pulmonary:     Effort: Pulmonary effort is normal.  Musculoskeletal:     Right lower leg: Edema present.     Left lower leg: Edema present.  Skin:    General: Skin is warm and dry.  Neurological:     Mental Status: She is alert and oriented to person, place, and time.  Psychiatric:        Mood and Affect: Mood normal.        Behavior: Behavior normal.        Thought Content: Thought content normal.        Judgment: Judgment normal.     BP (!) 152/88 (BP Location: Right Arm)   Pulse 80   Resp 16   Wt (!) 379 lb 12.8 oz (172.3 kg)   BMI 52.97 kg/m   Past Medical History:  Diagnosis Date   Irregular heartbeat     Social History   Socioeconomic History   Marital status: Married    Spouse name: Not on file   Number of children: Not on file   Years of education: Not on file   Highest education level: Not on file  Occupational History   Not on file  Tobacco Use   Smoking status: Never   Smokeless tobacco: Never  Substance and Sexual Activity   Alcohol use: No   Drug use: Never   Sexual activity: Not on file  Other Topics Concern   Not on file  Social History Narrative   Not on file   Social Determinants of Health   Financial Resource Strain: Not on file   Food Insecurity: Not on file  Transportation Needs: Not on file  Physical Activity: Not on file  Stress: Not on file  Social Connections: Not on file  Intimate Partner Violence: Not on file    Past Surgical History:  Procedure Laterality Date   ABDOMINAL HYSTERECTOMY     FRACTURE SURGERY      Family History  Problem Relation Age of Onset   Pancreatic cancer Father    Diabetes Sister    Anxiety disorder Brother    Diabetes Maternal Grandfather     No Known Allergies      No data to display            CMP  No results found for: "NA", "K", "CL", "CO2", "GLUCOSE", "BUN", "CREATININE", "CALCIUM", "PROT", "ALBUMIN", "AST", "ALT", "ALKPHOS", "BILITOT", "GFRNONAA", "GFRAA"   No results found.     Assessment & Plan:   1. Lymphedema Today we will remove the patient from the wraps and helped her into her new compression garments.  We reiterated the best choice for conservative therapy.  She should place her compression socks first thing in the morning and remove before bedtime.  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  She is instructed to utilize these daily.  She is also instructed to elevate her lower extremities when possible.  She is also trying to be active 3 to 4 days/week.  We will have her return in 3 weeks to evaluate progress with edema  2. Class 3 severe obesity with body mass index (BMI) of 50.0 to 59.9 in adult, unspecified obesity type, unspecified whether serious comorbidity present [facility]) This continues to contribute to the patient's worsening edema.   Current Outpatient Medications on File Prior to Visit  Medication Sig Dispense Refill   amoxicillin-clavulanate (AUGMENTIN) 875-125 MG tablet Take 1 tablet by mouth 2 (two) times daily.     furosemide (LASIX) 20 MG tablet Take 20 mg by mouth.     ibuprofen (ADVIL) 200 MG tablet Take 200 mg by mouth every 6 (six) hours as needed.      montelukast (SINGULAIR) 10 MG tablet Take 10 mg by mouth daily.     mupirocin ointment (BACTROBAN) 2 % Apply 1 Application topically 3 (three) times daily.     ALPRAZolam (XANAX) 0.5 MG tablet Take 0.5 mg by mouth 2 (two) times daily as needed. (Patient not taking: Reported on 01/16/2022)     aspirin-acetaminophen-caffeine (EXCEDRIN MIGRAINE) 250-250-65 MG tablet Take by mouth every 6 (six) hours as needed for headache. (Patient not taking: Reported on 04/04/2022)     ciprofloxacin (CIPRO) 250 MG tablet Take 250 mg by mouth 2 (two) times daily. (Patient not taking: Reported on 05/22/2022)     escitalopram (LEXAPRO) 10 MG tablet Take 10 mg by mouth daily. (Patient not taking: Reported on 01/16/2022)     No current facility-administered medications on file prior to visit.    There are no Patient Instructions on file for this visit. No follow-ups on file.   [name], NP

## 2022-06-27 ENCOUNTER — Ambulatory Visit (INDEPENDENT_AMBULATORY_CARE_PROVIDER_SITE_OTHER): Payer: BC Managed Care – PPO | Admitting: Nurse Practitioner

## 2022-07-05 DIAGNOSIS — K219 Gastro-esophageal reflux disease without esophagitis: Secondary | ICD-10-CM | POA: Diagnosis not present

## 2022-07-05 DIAGNOSIS — J302 Other seasonal allergic rhinitis: Secondary | ICD-10-CM | POA: Diagnosis not present

## 2022-07-05 DIAGNOSIS — J301 Allergic rhinitis due to pollen: Secondary | ICD-10-CM | POA: Diagnosis not present

## 2022-09-19 DIAGNOSIS — J302 Other seasonal allergic rhinitis: Secondary | ICD-10-CM | POA: Diagnosis not present

## 2022-09-19 DIAGNOSIS — K219 Gastro-esophageal reflux disease without esophagitis: Secondary | ICD-10-CM | POA: Diagnosis not present

## 2022-10-23 DIAGNOSIS — Z0000 Encounter for general adult medical examination without abnormal findings: Secondary | ICD-10-CM | POA: Diagnosis not present

## 2022-10-23 DIAGNOSIS — R7303 Prediabetes: Secondary | ICD-10-CM | POA: Diagnosis not present

## 2022-10-23 DIAGNOSIS — Z13220 Encounter for screening for lipoid disorders: Secondary | ICD-10-CM | POA: Diagnosis not present

## 2022-10-30 DIAGNOSIS — Z0000 Encounter for general adult medical examination without abnormal findings: Secondary | ICD-10-CM | POA: Diagnosis not present

## 2022-10-30 DIAGNOSIS — M79604 Pain in right leg: Secondary | ICD-10-CM | POA: Diagnosis not present

## 2022-10-30 DIAGNOSIS — R7303 Prediabetes: Secondary | ICD-10-CM | POA: Diagnosis not present

## 2022-12-11 DIAGNOSIS — F411 Generalized anxiety disorder: Secondary | ICD-10-CM | POA: Diagnosis not present

## 2022-12-11 DIAGNOSIS — Z6841 Body Mass Index (BMI) 40.0 and over, adult: Secondary | ICD-10-CM | POA: Diagnosis not present

## 2022-12-11 DIAGNOSIS — R7303 Prediabetes: Secondary | ICD-10-CM | POA: Diagnosis not present

## 2023-01-14 ENCOUNTER — Ambulatory Visit (INDEPENDENT_AMBULATORY_CARE_PROVIDER_SITE_OTHER): Payer: BC Managed Care – PPO | Admitting: Vascular Surgery

## 2023-01-14 DIAGNOSIS — M7989 Other specified soft tissue disorders: Secondary | ICD-10-CM

## 2023-01-14 NOTE — Progress Notes (Signed)
History of Present Illness  There is no documented history at this time  Assessments & Plan   There are no diagnoses linked to this encounter.    Additional instructions  Subjective:  Patient presents with venous ulcer of the Bilateral lower extremity.    Procedure:  3 layer unna wrap was placed Bilateral lower extremity.   Plan:   Follow up in one week.  

## 2023-01-21 ENCOUNTER — Encounter (INDEPENDENT_AMBULATORY_CARE_PROVIDER_SITE_OTHER): Payer: Self-pay

## 2023-01-21 ENCOUNTER — Ambulatory Visit (INDEPENDENT_AMBULATORY_CARE_PROVIDER_SITE_OTHER): Payer: BC Managed Care – PPO | Admitting: Nurse Practitioner

## 2023-01-21 VITALS — BP 166/99 | HR 73 | Resp 18

## 2023-01-21 DIAGNOSIS — I890 Lymphedema, not elsewhere classified: Secondary | ICD-10-CM

## 2023-01-21 NOTE — Progress Notes (Signed)
History of Present Illness  There is no documented history at this time  Assessments & Plan   There are no diagnoses linked to this encounter.    Additional instructions  Subjective:  Patient presents with venous ulcer of the Bilateral lower extremity.    Procedure:  3 layer unna wrap was placed Bilateral lower extremity.   Plan:   Follow up in one week.  

## 2023-01-28 ENCOUNTER — Ambulatory Visit (INDEPENDENT_AMBULATORY_CARE_PROVIDER_SITE_OTHER): Payer: BC Managed Care – PPO | Admitting: Nurse Practitioner

## 2023-01-28 ENCOUNTER — Encounter (INDEPENDENT_AMBULATORY_CARE_PROVIDER_SITE_OTHER): Payer: Self-pay

## 2023-01-28 VITALS — BP 148/87 | HR 94 | Resp 16

## 2023-01-28 DIAGNOSIS — I890 Lymphedema, not elsewhere classified: Secondary | ICD-10-CM | POA: Diagnosis not present

## 2023-01-28 NOTE — Progress Notes (Signed)
History of Present Illness  There is no documented history at this time  Assessments & Plan   There are no diagnoses linked to this encounter.    Additional instructions  Subjective:  Patient presents with venous ulcer of the Bilateral lower extremity.    Procedure:  3 layer unna wrap was placed Bilateral lower extremity.   Plan:   Follow up in one week.  

## 2023-02-04 ENCOUNTER — Encounter (INDEPENDENT_AMBULATORY_CARE_PROVIDER_SITE_OTHER): Payer: Self-pay

## 2023-02-04 ENCOUNTER — Ambulatory Visit (INDEPENDENT_AMBULATORY_CARE_PROVIDER_SITE_OTHER): Payer: BC Managed Care – PPO | Admitting: Nurse Practitioner

## 2023-02-04 VITALS — BP 154/81 | HR 84 | Resp 18

## 2023-02-04 DIAGNOSIS — I890 Lymphedema, not elsewhere classified: Secondary | ICD-10-CM | POA: Diagnosis not present

## 2023-02-04 NOTE — Progress Notes (Signed)
History of Present Illness  There is no documented history at this time  Assessments & Plan   There are no diagnoses linked to this encounter.    Additional instructions  Subjective:  Patient presents with venous ulcer of the Bilateral lower extremity.    Procedure:  3 layer unna wrap was placed Bilateral lower extremity.   Plan:   Follow up in one week.  

## 2023-02-11 ENCOUNTER — Ambulatory Visit (INDEPENDENT_AMBULATORY_CARE_PROVIDER_SITE_OTHER): Payer: BC Managed Care – PPO | Admitting: Nurse Practitioner

## 2023-02-11 ENCOUNTER — Encounter (INDEPENDENT_AMBULATORY_CARE_PROVIDER_SITE_OTHER): Payer: Self-pay

## 2023-02-11 VITALS — BP 148/79 | HR 65 | Resp 16 | Wt 381.6 lb

## 2023-02-11 DIAGNOSIS — I890 Lymphedema, not elsewhere classified: Secondary | ICD-10-CM | POA: Diagnosis not present

## 2023-02-11 NOTE — Progress Notes (Signed)
History of Present Illness  There is no documented history at this time  Assessments & Plan   There are no diagnoses linked to this encounter.    Additional instructions  Subjective:  Patient presents with venous ulcer of the Bilateral lower extremity.    Procedure:  3 layer unna wrap was placed Bilateral lower extremity.   Plan:   Follow up in one week.  

## 2023-02-12 ENCOUNTER — Encounter (INDEPENDENT_AMBULATORY_CARE_PROVIDER_SITE_OTHER): Payer: Self-pay | Admitting: Nurse Practitioner

## 2023-02-18 ENCOUNTER — Encounter (INDEPENDENT_AMBULATORY_CARE_PROVIDER_SITE_OTHER): Payer: Self-pay | Admitting: Nurse Practitioner

## 2023-02-18 ENCOUNTER — Ambulatory Visit (INDEPENDENT_AMBULATORY_CARE_PROVIDER_SITE_OTHER): Payer: BC Managed Care – PPO | Admitting: Nurse Practitioner

## 2023-02-18 VITALS — BP 154/84 | HR 86 | Resp 16

## 2023-02-18 DIAGNOSIS — Z6841 Body Mass Index (BMI) 40.0 and over, adult: Secondary | ICD-10-CM | POA: Diagnosis not present

## 2023-02-18 DIAGNOSIS — I890 Lymphedema, not elsewhere classified: Secondary | ICD-10-CM | POA: Diagnosis not present

## 2023-02-18 DIAGNOSIS — J4520 Mild intermittent asthma, uncomplicated: Secondary | ICD-10-CM | POA: Diagnosis not present

## 2023-02-18 NOTE — Progress Notes (Signed)
Subjective:    Patient ID: Joanna Nielsen, female    DOB: August 13, 1961, 62 y.o.   MRN: [id number] Chief Complaint  Patient presents with   Follow-up    Unna boot follow up    Joanna Nielsen is a 62 year old female who returns today for follow-up after being in Unna boots for several weeks.  The patient has a known history of lymphedema and had an injury by her cat and had new wounds develop with weeping.  After several weeks and Unna boots this weeping has resolved and there is currently no open wounds or ulcerations.  The patient notes that her legs feel much better after several weeks of Unna wraps as well.    Review of Systems  Cardiovascular:  Positive for leg swelling.  All other systems reviewed and are negative.      Objective:   Physical Exam Vitals reviewed.  HENT:     Head: Normocephalic.  Cardiovascular:     Rate and Rhythm: Normal rate.  Pulmonary:     Effort: Pulmonary effort is normal.  Musculoskeletal:     Right lower leg: Edema present.     Left lower leg: Edema present.  Skin:    General: Skin is warm and dry.  Neurological:     Mental Status: She is alert and oriented to person, place, and time.  Psychiatric:        Mood and Affect: Mood normal.        Behavior: Behavior normal.        Thought Content: Thought content normal.        Judgment: Judgment normal.     BP (!) 154/84 (BP Location: Right Arm)   Pulse 86   Resp 16   Past Medical History:  Diagnosis Date   Irregular heartbeat     Social History   Socioeconomic History   Marital status: Married    Spouse name: Not on file   Number of children: Not on file   Years of education: Not on file   Highest education level: Not on file  Occupational History   Not on file  Tobacco Use   Smoking status: Never   Smokeless tobacco: Never  Substance and Sexual Activity   Alcohol use: No   Drug use: Never   Sexual activity: Not on file  Other Topics Concern   Not on file  Social History  Narrative   Not on file   Social Determinants of Health   Financial Resource Strain: Not on file  Food Insecurity: Not on file  Transportation Needs: Not on file  Physical Activity: Not on file  Stress: Not on file  Social Connections: Not on file  Intimate Partner Violence: Not on file    Past Surgical History:  Procedure Laterality Date   ABDOMINAL HYSTERECTOMY     FRACTURE SURGERY      Family History  Problem Relation Age of Onset   Pancreatic cancer Father    Diabetes Sister    Anxiety disorder Brother    Diabetes Maternal Grandfather     No Known Allergies      No data to display            CMP  No results found for: "NA", "K", "CL", "CO2", "GLUCOSE", "BUN", "CREATININE", "CALCIUM", "PROT", "ALBUMIN", "AST", "ALT", "ALKPHOS", "BILITOT", "GFRNONAA", "GFRAA"   No results found.     Assessment & Plan:   1. Lymphedema Today the patient's wounds are all healed.  She has been  doing well with the compression wraps and her lymphedema pump prior to this incident.  At this time we will not put patient back into wraps and she will continue with her conservative therapies.  Will plan on having her return in 6 months or sooner if issues arise.  2. Class 3 severe obesity with body mass index (BMI) of 50.0 to 59.9 in adult, unspecified obesity type, unspecified whether serious comorbidity present (HCC) Plays a role in lower extremity edema  3. Asthma, stable, mild intermittent Continue pulmonary medications and aerosols as already ordered, these medications have been reviewed and there are no changes at this time.    Current Outpatient Medications on File Prior to Visit  Medication Sig Dispense Refill   amoxicillin-clavulanate (AUGMENTIN) 875-125 MG tablet Take 1 tablet by mouth 2 (two) times daily.     furosemide (LASIX) 20 MG tablet Take 20 mg by mouth.     ibuprofen (ADVIL) 200 MG tablet Take 200 mg by mouth every 6 (six) hours as needed.     montelukast  (SINGULAIR) 10 MG tablet Take 10 mg by mouth daily.     mupirocin ointment (BACTROBAN) 2 % Apply 1 Application topically 3 (three) times daily.     ALPRAZolam (XANAX) 0.5 MG tablet Take 0.5 mg by mouth 2 (two) times daily as needed. (Patient not taking: Reported on 01/16/2022)     aspirin-acetaminophen-caffeine (EXCEDRIN MIGRAINE) 250-250-65 MG tablet Take by mouth every 6 (six) hours as needed for headache. (Patient not taking: Reported on 04/04/2022)     ciprofloxacin (CIPRO) 250 MG tablet Take 250 mg by mouth 2 (two) times daily. (Patient not taking: Reported on 05/22/2022)     escitalopram (LEXAPRO) 10 MG tablet Take 10 mg by mouth daily. (Patient not taking: Reported on 01/16/2022)     No current facility-administered medications on file prior to visit.    There are no Patient Instructions on file for this visit. No follow-ups on file.   Joanna Spinner, NP

## 2023-04-23 DIAGNOSIS — F411 Generalized anxiety disorder: Secondary | ICD-10-CM | POA: Diagnosis not present

## 2023-04-23 DIAGNOSIS — R7303 Prediabetes: Secondary | ICD-10-CM | POA: Diagnosis not present

## 2023-05-17 DIAGNOSIS — M85861 Other specified disorders of bone density and structure, right lower leg: Secondary | ICD-10-CM | POA: Diagnosis not present

## 2023-05-17 DIAGNOSIS — M1711 Unilateral primary osteoarthritis, right knee: Secondary | ICD-10-CM | POA: Diagnosis not present

## 2023-05-17 DIAGNOSIS — M25561 Pain in right knee: Secondary | ICD-10-CM | POA: Diagnosis not present

## 2023-05-27 DIAGNOSIS — F411 Generalized anxiety disorder: Secondary | ICD-10-CM | POA: Diagnosis not present

## 2023-05-27 DIAGNOSIS — R7303 Prediabetes: Secondary | ICD-10-CM | POA: Diagnosis not present

## 2023-05-27 DIAGNOSIS — M1711 Unilateral primary osteoarthritis, right knee: Secondary | ICD-10-CM | POA: Diagnosis not present

## 2023-06-22 DIAGNOSIS — M5441 Lumbago with sciatica, right side: Secondary | ICD-10-CM | POA: Diagnosis not present

## 2023-06-23 DIAGNOSIS — M47816 Spondylosis without myelopathy or radiculopathy, lumbar region: Secondary | ICD-10-CM | POA: Diagnosis not present

## 2023-06-23 DIAGNOSIS — M545 Low back pain, unspecified: Secondary | ICD-10-CM | POA: Diagnosis not present

## 2023-08-08 DIAGNOSIS — S60221A Contusion of right hand, initial encounter: Secondary | ICD-10-CM | POA: Diagnosis not present

## 2023-08-08 DIAGNOSIS — Z0189 Encounter for other specified special examinations: Secondary | ICD-10-CM | POA: Diagnosis not present

## 2023-08-08 DIAGNOSIS — S4991XA Unspecified injury of right shoulder and upper arm, initial encounter: Secondary | ICD-10-CM | POA: Diagnosis not present

## 2023-08-08 DIAGNOSIS — S60211A Contusion of right wrist, initial encounter: Secondary | ICD-10-CM | POA: Diagnosis not present

## 2023-08-16 ENCOUNTER — Emergency Department: Payer: BC Managed Care – PPO

## 2023-08-16 ENCOUNTER — Emergency Department
Admission: EM | Admit: 2023-08-16 | Discharge: 2023-08-16 | Disposition: A | Discharge status: Home / Self Care | Payer: BC Managed Care – PPO | Attending: Emergency Medicine | Admitting: Emergency Medicine

## 2023-08-16 ENCOUNTER — Other Ambulatory Visit: Payer: Self-pay

## 2023-08-16 DIAGNOSIS — W01198A Fall on same level from slipping, tripping and stumbling with subsequent striking against other object, initial encounter: Secondary | ICD-10-CM | POA: Diagnosis not present

## 2023-08-16 DIAGNOSIS — S199XXA Unspecified injury of neck, initial encounter: Secondary | ICD-10-CM | POA: Diagnosis not present

## 2023-08-16 DIAGNOSIS — J45909 Unspecified asthma, uncomplicated: Secondary | ICD-10-CM | POA: Diagnosis not present

## 2023-08-16 DIAGNOSIS — S0990XA Unspecified injury of head, initial encounter: Secondary | ICD-10-CM | POA: Diagnosis not present

## 2023-08-16 DIAGNOSIS — S60221A Contusion of right hand, initial encounter: Secondary | ICD-10-CM

## 2023-08-16 DIAGNOSIS — M25511 Pain in right shoulder: Secondary | ICD-10-CM | POA: Diagnosis not present

## 2023-08-16 DIAGNOSIS — R220 Localized swelling, mass and lump, head: Secondary | ICD-10-CM | POA: Diagnosis not present

## 2023-08-16 DIAGNOSIS — W19XXXA Unspecified fall, initial encounter: Secondary | ICD-10-CM

## 2023-08-16 DIAGNOSIS — S0093XA Contusion of unspecified part of head, initial encounter: Secondary | ICD-10-CM

## 2023-08-16 DIAGNOSIS — M85811 Other specified disorders of bone density and structure, right shoulder: Secondary | ICD-10-CM | POA: Diagnosis not present

## 2023-08-16 DIAGNOSIS — M85841 Other specified disorders of bone density and structure, right hand: Secondary | ICD-10-CM | POA: Diagnosis not present

## 2023-08-16 DIAGNOSIS — M79641 Pain in right hand: Secondary | ICD-10-CM | POA: Diagnosis not present

## 2023-08-16 MED ORDER — ACETAMINOPHEN 325 MG PO TABS
650.0000 mg | ORAL_TABLET | Freq: Once | ORAL | Status: AC
  Administered 2023-08-16: 650 mg via ORAL
  Filled 2023-08-16: qty 2

## 2023-08-16 NOTE — ED Provider Notes (Signed)
[facility] Provider Note    Event Date/Time   First MD Initiated Contact with Patient 08/16/23 0957     (approximate)   History   Fall   HPI  Joanna Nielsen is a 62 y.o. female with a past medical history of morbid obesity and lymphedema who presents today for evaluation after a fall that happened yesterday.  Patient reports that she tripped and struck her head on an entertainment center.  She did not lose consciousness.  She reports that she has noticed a small indent to the front of her head which made her concerned.  She does not take anticoagulation.  She has not had any fevers or chills.  No visual changes.  She reports that she hit her right arm in the process and there is pain there as well.  No weakness or paresthesias.  She denies preceding symptoms, no palpitations, shortness of breath, chest pain, or other concerns today.  Patient Active Problem List   Diagnosis Date Noted   Allergy 11/09/2019   Complete rupture of rotator cuff 11/09/2019   Migraine 11/09/2019   Obesity 11/09/2019   Swelling of limb 11/09/2019   Asthma, stable, mild intermittent 09/20/2015   Prediabetes 09/20/2015          Physical Exam   Triage Vital Signs: ED Triage Vitals [08/16/23 0933]  Encounter Vitals Group     BP (!) 148/64     Systolic BP Percentile      Diastolic BP Percentile      Pulse Rate 75     Resp 17     Temp 97.9 F (36.6 C)     Temp Source Oral     SpO2 98 %     Weight (!) 381 lb 9.9 oz (173.1 kg)     Height 5\' 11"  (1.803 m)     Head Circumference      Peak Flow      Pain Score 4     Pain Loc      Pain Education      Exclude from Growth Chart     Most recent vital signs: Vitals:   08/16/23 0933  BP: (!) 148/64  Pulse: 75  Resp: 17  Temp: 97.9 F (36.6 C)  SpO2: 98%    Physical Exam Vitals and nursing note reviewed.  Constitutional:      General: Awake and alert. No acute distress.    Appearance: Normal appearance. The  patient is morbidly obese.  HENT:     Head: Normocephalic and atraumatic.     Mouth: Mucous membranes are moist.  Eyes:     General: PERRL. Normal EOMs        Right eye: No discharge.        Left eye: No discharge.     Conjunctiva/sclera: Conjunctivae normal.  Cardiovascular:     Rate and Rhythm: Normal rate and regular rhythm.     Pulses: Normal pulses.  Pulmonary:     Effort: Pulmonary effort is normal. No respiratory distress.     Breath sounds: Normal breath sounds.  Abdominal:     Abdomen is soft. There is no abdominal tenderness. No rebound or guarding. No distention. Musculoskeletal:        General: No swelling. Normal range of motion.     Cervical back: Normal range of motion and neck supple. No midline cervical spine tenderness.  Full range of motion of neck.  Negative Spurling test.  Negative Lhermitte sign.  Normal  strength and sensation in bilateral upper extremities. Normal grip strength bilaterally.  Normal intrinsic muscle function of the hand bilaterally.  Normal radial pulses bilaterally. Skin:    General: Skin is warm and dry.     Capillary Refill: Capillary refill takes less than 2 seconds.     Findings: No rash.  Neurological:     Mental Status: The patient is awake and alert.   Neurological: GCS 15 alert and oriented x3 Normal speech, no expressive or receptive aphasia or dysarthria Cranial nerves II through XII intact Normal visual fields 5 out of 5 strength in all 4 extremities with intact sensation throughout No extremity drift Normal finger-to-nose testing, no limb or truncal ataxia    ED Results / Procedures / Treatments   Labs (all labs ordered are listed, but only abnormal results are displayed) Labs Reviewed - No data to display   EKG     RADIOLOGY I independently reviewed and interpreted imaging and agree with radiologists findings.     PROCEDURES:  Critical Care performed:   Procedures   MEDICATIONS ORDERED IN  ED: Medications  acetaminophen (TYLENOL) tablet 650 mg (650 mg Oral Given 08/16/23 1025)     IMPRESSION / MDM / ASSESSMENT AND PLAN / ED COURSE  I reviewed the triage vital signs and the nursing notes.   Differential diagnosis includes, but is not limited to, contusion, concussion, fracture, dislocation, intracranial hemorrhage, skull fracture.  Patient is awake and alert, hemodynamically stable and afebrile.  She is neurologically intact.  CT head and neck obtained given mechanism of injury and are negative for any acute findings.  X-ray of her hand and shoulder also obtained given that these are her locations of pain, though this was also negative for any acute findings.  Patient is reassured by these findings.  She plans on scheduling an appointment with her PCP regarding initiating a weight loss drug, and has an appointment scheduled.  We have discussed return precautions in the meantime.  Patient understands and agrees with plan.  She was given a work note per her request.  She was discharged with her family member.  She is ambulatory with a steady gait.   Patient's presentation is most consistent with acute complicated illness / injury requiring diagnostic workup.    FINAL CLINICAL IMPRESSION(S) / ED DIAGNOSES   Final diagnoses:  Injury of head, initial encounter  Contusion of head, unspecified part of head, initial encounter  Fall, initial encounter  Acute pain of right shoulder  Contusion of right hand, initial encounter     Rx / DC Orders   ED Discharge Orders     None        Note:  This document was prepared using Dragon voice recognition software and may include unintentional dictation errors.   Keturah Shavers 08/16/23 1134    Jene Every, MD 08/16/23 1340

## 2023-08-16 NOTE — ED Triage Notes (Addendum)
Pt here after a fall this morning. Pt states she does not remember what happened before the fall. Pt states she did hit her head but she is not on a blood thinner. Pt is concerned about a soft spot on her head. Pt also c/o right arm from a previous fall as well.  Pt in wheelchair.

## 2023-08-16 NOTE — Discharge Instructions (Signed)
Your x-rays and CT scans were normal today.  Please follow-up with your outpatient provider.  Please return for any new, worsening, or change in symptoms or other concerns.  It was a pleasure caring for you today.

## 2023-08-19 ENCOUNTER — Ambulatory Visit (INDEPENDENT_AMBULATORY_CARE_PROVIDER_SITE_OTHER): Payer: BC Managed Care – PPO | Admitting: Vascular Surgery

## 2023-08-19 DIAGNOSIS — R7303 Prediabetes: Secondary | ICD-10-CM | POA: Diagnosis not present

## 2023-08-19 DIAGNOSIS — S0993XS Unspecified injury of face, sequela: Secondary | ICD-10-CM | POA: Diagnosis not present

## 2023-08-19 DIAGNOSIS — R296 Repeated falls: Secondary | ICD-10-CM | POA: Diagnosis not present

## 2023-08-19 DIAGNOSIS — E66813 Obesity, class 3: Secondary | ICD-10-CM | POA: Diagnosis not present

## 2023-08-29 ENCOUNTER — Telehealth: Payer: Self-pay

## 2023-08-29 NOTE — Telephone Encounter (Signed)
Transition Care Management Follow-up Telephone Call Date of discharge and from where: 08/16/2023 [facility] How have you been since you were released from the hospital? Patient stated she is feeling better but is still stiff and sore. Any questions or concerns? No  Items Reviewed: Did the pt receive and understand the discharge instructions provided? Yes  Medications obtained and verified?  No medication prescribed only OTC. Other? No  Any new allergies since your discharge? No  Dietary orders reviewed? Yes Do you have support at home? Yes   Follow up appointments reviewed:  PCP Hospital f/u appt confirmed?  Patient stated she was instructed to follow-up with PCP as needed.  Scheduled to see  on  @ . Specialist Hospital f/u appt confirmed? No  Scheduled to see  on  @ . Are transportation arrangements needed? No  If their condition worsens, is the pt aware to call PCP or go to the Emergency Dept.? Yes Was the patient provided with contact information for the PCP's office or ED? Yes Was to pt encouraged to call back with questions or concerns? Yes   Joanna Nielsen [facility]  [facility], [facility] Guide Direct Dial: [phone]  Website: [email or web address]

## 2023-09-23 ENCOUNTER — Encounter (INDEPENDENT_AMBULATORY_CARE_PROVIDER_SITE_OTHER): Payer: Self-pay | Admitting: Vascular Surgery

## 2023-09-23 ENCOUNTER — Ambulatory Visit (INDEPENDENT_AMBULATORY_CARE_PROVIDER_SITE_OTHER): Payer: BC Managed Care – PPO | Admitting: Vascular Surgery

## 2023-09-23 VITALS — BP 153/83 | HR 80 | Resp 16 | Wt 386.0 lb

## 2023-09-23 DIAGNOSIS — M7989 Other specified soft tissue disorders: Secondary | ICD-10-CM | POA: Diagnosis not present

## 2023-09-23 DIAGNOSIS — R7303 Prediabetes: Secondary | ICD-10-CM | POA: Diagnosis not present

## 2023-09-23 DIAGNOSIS — I890 Lymphedema, not elsewhere classified: Secondary | ICD-10-CM

## 2023-09-23 NOTE — Progress Notes (Signed)
MRN : [id number]  Joanna Nielsen is a 62 y.o. ([birth year]) female who presents with chief complaint of  Chief Complaint  Patient presents with   Follow-up    6 month follow up  .  History of Present Illness: Patient returns today in follow up of her leg swelling and lymphedema.  She had to have Unna wraps placed for several weeks earlier this year.  She has had no recurrent ulceration or infection.  Her legs do remain significantly swollen.  She has been unable to use her lymphedema pump because she has gained so much weight she cannot get the sleeves on.  The weight gain also worsens her lower extremity swelling significantly.  She does wear her Velcro compression systems daily and has been elevating her legs.  She walks is much as possible, but with her size that is getting more difficult.  She has marked hyperpigmentation and dry scaly skin throughout the lower parts of both legs at this point without open ulcerations.  Current Outpatient Medications  Medication Sig Dispense Refill   furosemide (LASIX) 20 MG tablet Take 20 mg by mouth as needed.     HYDROcodone-acetaminophen (NORCO/VICODIN) 5-325 MG tablet 1 tablet every 6 (six) hours as needed.     naproxen (NAPROSYN) 500 MG tablet Take 500 mg by mouth 2 (two) times daily.     ALPRAZolam (XANAX) 0.5 MG tablet Take 0.5 mg by mouth 2 (two) times daily as needed. (Patient not taking: Reported on 01/16/2022)     amoxicillin-clavulanate (AUGMENTIN) 875-125 MG tablet Take 1 tablet by mouth 2 (two) times daily. (Patient not taking: Reported on 09/23/2023)     aspirin-acetaminophen-caffeine (EXCEDRIN MIGRAINE) 250-250-65 MG tablet Take by mouth every 6 (six) hours as needed for headache. (Patient not taking: Reported on 04/04/2022)     ciprofloxacin (CIPRO) 250 MG tablet Take 250 mg by mouth 2 (two) times daily. (Patient not taking: Reported on 05/22/2022)     escitalopram (LEXAPRO) 10 MG tablet Take 10 mg by mouth daily. (Patient not taking:  Reported on 01/16/2022)     ibuprofen (ADVIL) 200 MG tablet Take 200 mg by mouth every 6 (six) hours as needed. (Patient not taking: Reported on 09/23/2023)     montelukast (SINGULAIR) 10 MG tablet Take 10 mg by mouth daily. (Patient not taking: Reported on 09/23/2023)     mupirocin ointment (BACTROBAN) 2 % Apply 1 Application topically 3 (three) times daily. (Patient not taking: Reported on 09/23/2023)     No current facility-administered medications for this visit.    Past Medical History:  Diagnosis Date   Irregular heartbeat     Past Surgical History:  Procedure Laterality Date   ABDOMINAL HYSTERECTOMY     FRACTURE SURGERY       Social History   Tobacco Use   Smoking status: Never   Smokeless tobacco: Never  Substance Use Topics   Alcohol use: No   Drug use: Never      Family History  Problem Relation Age of Onset   Pancreatic cancer Father    Diabetes Sister    Anxiety disorder Brother    Diabetes Maternal Grandfather      No Known Allergies  REVIEW OF SYSTEMS (Negative unless checked)   Constitutional: [] Weight loss  [] Fever  [] Chills Cardiac: [] Chest pain   [] Chest pressure   [x] Palpitations   [] Shortness of breath when laying flat   [] Shortness of breath at rest   [x] Shortness of breath with exertion. Vascular:  [] Pain  in legs with walking   [] Pain in legs at rest   [] Pain in legs when laying flat   [] Claudication   [] Pain in feet when walking  [] Pain in feet at rest  [] Pain in feet when laying flat   [] History of DVT   [] Phlebitis   [x] Swelling in legs   [] Varicose veins   [] Non-healing ulcers Pulmonary:   [] Uses home oxygen   [] Productive cough   [] Hemoptysis   [] Wheeze  [] COPD   [x] Asthma Neurologic:  [] Dizziness  [] Blackouts   [] Seizures   [] History of stroke   [] History of TIA  [] Aphasia   [] Temporary blindness   [] Dysphagia   [] Weakness or numbness in arms   [] Weakness or numbness in legs Musculoskeletal:  [x] Arthritis   [] Joint swelling   [] Joint pain    [] Low back pain Hematologic:  [] Easy bruising  [] Easy bleeding   [] Hypercoagulable state   [] Anemic  [] Hepatitis Gastrointestinal:  [] Blood in stool   [] Vomiting blood  [] Gastroesophageal reflux/heartburn   [] Abdominal pain Genitourinary:  [] Chronic kidney disease   [] Difficult urination  [] Frequent urination  [] Burning with urination   [] Hematuria Skin:  [] Rashes   [] Ulcers   [] Wounds Psychological:  [] History of anxiety   []  History of major depression.  Physical Examination  BP (!) 153/83 (BP Location: Right Arm)   Pulse 80   Resp 16   Wt (!) 386 lb (175.1 kg)   BMI 53.84 kg/m  Gen:  WD/WN, NAD. Morbidly obese  Head: [place]/AT, No temporalis wasting. Ear/Nose/Throat: Hearing grossly intact, nares w/o erythema or drainage Eyes: Conjunctiva clear. Sclera non-icteric Neck: Supple.  Trachea midline Pulmonary:  Good air movement, no use of accessory muscles.  Cardiac: RRR, no JVD Vascular:  Vessel Right Left  Radial Palpable Palpable                          PT Not Palpable Not Palpable  DP Not Palpable Not Palpable   Gastrointestinal: soft, non-tender/non-distended. No guarding/reflex.  Musculoskeletal: M/S 5/5 throughout.  No deformity or atrophy.  Marked hyperpigmentation with thick dry scaling skin on both lower legs.  2-3+ bilateral lower extremity edema. Neurologic: Sensation grossly intact in extremities.  Symmetrical.  Speech is fluent.  Psychiatric: Judgment intact, Mood & affect appropriate for pt's clinical situation. Dermatologic: No rashes or ulcers noted.  No cellulitis or open wounds.      Labs No results found for this or any previous visit (from the past 2160 hour(s)).  Radiology No results found.  Assessment/Plan  Prediabetes blood glucose control important in reducing the progression of atherosclerotic disease. Also, involved in wound healing. Not on meds   Swelling of limb Still quite prominent.  Morbidly obese (HCC) Her increased weight gain  is worsening her lower extremity swelling.  Lymphedema The patient has severe stage III lymphedema with marked hyperpigmentation, severe swelling refractory to compression and elevation, and her lymphedema pump is currently not large enough to house her legs.  We will try to get the lymphedema pump company to replace the sleeves to a larger size.  I have discussed the importance of compression, elevation, exercise, and the use of the pump.  We will continue to follow her on 59-month intervals.    Festus Barren, MD  09/23/2023 10:07 AM    This note was created with Dragon medical transcription system.  Any errors from dictation are purely unintentional

## 2023-09-23 NOTE — Assessment & Plan Note (Signed)
The patient has severe stage III lymphedema with marked hyperpigmentation, severe swelling refractory to compression and elevation, and her lymphedema pump is currently not large enough to house her legs.  We will try to get the lymphedema pump company to replace the sleeves to a larger size.  I have discussed the importance of compression, elevation, exercise, and the use of the pump.  We will continue to follow her on 7-month intervals.

## 2023-09-23 NOTE — Assessment & Plan Note (Signed)
blood glucose control important in reducing the progression of atherosclerotic disease. Also, involved in wound healing. Not on meds

## 2023-09-23 NOTE — Assessment & Plan Note (Signed)
Her increased weight gain is worsening her lower extremity swelling.

## 2023-09-23 NOTE — Assessment & Plan Note (Signed)
Still quite prominent.

## 2023-11-17 DIAGNOSIS — R7303 Prediabetes: Secondary | ICD-10-CM | POA: Diagnosis not present

## 2023-11-17 DIAGNOSIS — Z13220 Encounter for screening for lipoid disorders: Secondary | ICD-10-CM | POA: Diagnosis not present

## 2023-11-21 ENCOUNTER — Other Ambulatory Visit (HOSPITAL_COMMUNITY): Payer: Self-pay

## 2024-01-19 ENCOUNTER — Telehealth (INDEPENDENT_AMBULATORY_CARE_PROVIDER_SITE_OTHER): Payer: Self-pay | Admitting: Vascular Surgery

## 2024-01-19 NOTE — Telephone Encounter (Signed)
 Patient left voicemail stating she needs appointment this week to get her legs wrapped. She has a few places on each leg that are draining/dripping. Please advise if patient needs Roland Rack only or to see provider.

## 2024-01-19 NOTE — Telephone Encounter (Signed)
 Patient can be scheduled for unna wraps

## 2024-01-20 DIAGNOSIS — R7989 Other specified abnormal findings of blood chemistry: Secondary | ICD-10-CM | POA: Diagnosis not present

## 2024-01-20 DIAGNOSIS — Z6841 Body Mass Index (BMI) 40.0 and over, adult: Secondary | ICD-10-CM | POA: Diagnosis not present

## 2024-01-22 ENCOUNTER — Encounter (INDEPENDENT_AMBULATORY_CARE_PROVIDER_SITE_OTHER): Payer: Self-pay | Admitting: Nurse Practitioner

## 2024-01-22 ENCOUNTER — Ambulatory Visit (INDEPENDENT_AMBULATORY_CARE_PROVIDER_SITE_OTHER): Admitting: Nurse Practitioner

## 2024-01-22 VITALS — BP 149/89 | HR 81 | Resp 20

## 2024-01-22 DIAGNOSIS — I890 Lymphedema, not elsewhere classified: Secondary | ICD-10-CM

## 2024-01-22 NOTE — Progress Notes (Signed)
 History of Present Illness  There is no documented history at this time  Assessments & Plan   There are no diagnoses linked to this encounter.    Additional instructions  Subjective:  Patient presents with venous ulcer of the Bilateral lower extremity.    Procedure:  3 layer unna wrap was placed Bilateral lower extremity.   Plan:   Follow up in one week.

## 2024-01-29 ENCOUNTER — Encounter (INDEPENDENT_AMBULATORY_CARE_PROVIDER_SITE_OTHER): Payer: Self-pay

## 2024-01-29 ENCOUNTER — Ambulatory Visit (INDEPENDENT_AMBULATORY_CARE_PROVIDER_SITE_OTHER): Admitting: Nurse Practitioner

## 2024-01-29 VITALS — BP 142/84 | HR 82 | Resp 18

## 2024-01-29 DIAGNOSIS — I890 Lymphedema, not elsewhere classified: Secondary | ICD-10-CM

## 2024-01-29 NOTE — Progress Notes (Signed)
 History of Present Illness  There is no documented history at this time  Assessments & Plan   There are no diagnoses linked to this encounter.    Additional instructions  Subjective:  Patient presents with venous ulcer of the Bilateral lower extremity.    Procedure:  3 layer unna wrap was placed Bilateral lower extremity.   Plan:   Follow up in one week.

## 2024-02-01 ENCOUNTER — Encounter (INDEPENDENT_AMBULATORY_CARE_PROVIDER_SITE_OTHER): Payer: Self-pay | Admitting: Nurse Practitioner

## 2024-02-05 ENCOUNTER — Ambulatory Visit (INDEPENDENT_AMBULATORY_CARE_PROVIDER_SITE_OTHER): Admitting: Nurse Practitioner

## 2024-02-05 VITALS — BP 134/92 | HR 116 | Resp 18 | Ht 71.0 in

## 2024-02-05 DIAGNOSIS — I890 Lymphedema, not elsewhere classified: Secondary | ICD-10-CM | POA: Diagnosis not present

## 2024-02-05 NOTE — Progress Notes (Signed)
 History of Present Illness  There is no documented history at this time  Assessments & Plan   There are no diagnoses linked to this encounter.    Additional instructions  Subjective:  Patient presents with venous ulcer of the Bilateral lower extremity.    Procedure:  3 layer unna wrap was placed Bilateral lower extremity.   Plan:   Follow up in one week.

## 2024-02-06 ENCOUNTER — Ambulatory Visit (INDEPENDENT_AMBULATORY_CARE_PROVIDER_SITE_OTHER): Admitting: Nurse Practitioner

## 2024-02-06 ENCOUNTER — Encounter (INDEPENDENT_AMBULATORY_CARE_PROVIDER_SITE_OTHER): Payer: Self-pay | Admitting: Nurse Practitioner

## 2024-02-06 VITALS — BP 150/79 | HR 89 | Resp 18 | Ht 70.0 in | Wt 386.0 lb

## 2024-02-06 DIAGNOSIS — I890 Lymphedema, not elsewhere classified: Secondary | ICD-10-CM | POA: Diagnosis not present

## 2024-02-06 NOTE — Progress Notes (Signed)
 History of Present Illness  There is no documented history at this time  Assessments & Plan   There are no diagnoses linked to this encounter.    Additional instructions  Subjective:  Patient presents with venous ulcer of the Bilateral lower extremity.    Procedure:  3 layer unna wrap was placed Bilateral lower extremity.   Plan:   Follow up in one week.

## 2024-02-08 ENCOUNTER — Encounter (INDEPENDENT_AMBULATORY_CARE_PROVIDER_SITE_OTHER): Payer: Self-pay | Admitting: Nurse Practitioner

## 2024-02-12 ENCOUNTER — Encounter (INDEPENDENT_AMBULATORY_CARE_PROVIDER_SITE_OTHER): Payer: Self-pay | Admitting: Nurse Practitioner

## 2024-02-12 ENCOUNTER — Ambulatory Visit (INDEPENDENT_AMBULATORY_CARE_PROVIDER_SITE_OTHER): Admitting: Nurse Practitioner

## 2024-02-12 VITALS — BP 171/105 | HR 91 | Resp 18

## 2024-02-12 DIAGNOSIS — I890 Lymphedema, not elsewhere classified: Secondary | ICD-10-CM | POA: Diagnosis not present

## 2024-02-12 NOTE — Progress Notes (Unsigned)
 History of Present Illness  There is no documented history at this time  Assessments & Plan   There are no diagnoses linked to this encounter.    Additional instructions  Subjective:  Patient presents with venous ulcer of the Bilateral lower extremity.    Procedure:  3 layer unna wrap was placed Bilateral lower extremity.   Plan:   Follow up in one week.

## 2024-02-19 ENCOUNTER — Encounter (INDEPENDENT_AMBULATORY_CARE_PROVIDER_SITE_OTHER): Payer: Self-pay

## 2024-02-19 ENCOUNTER — Ambulatory Visit (INDEPENDENT_AMBULATORY_CARE_PROVIDER_SITE_OTHER): Admitting: Nurse Practitioner

## 2024-02-19 VITALS — BP 162/85 | HR 80 | Resp 18

## 2024-02-19 DIAGNOSIS — I890 Lymphedema, not elsewhere classified: Secondary | ICD-10-CM | POA: Diagnosis not present

## 2024-02-19 NOTE — Progress Notes (Signed)
 History of Present Illness  There is no documented history at this time  Assessments & Plan   There are no diagnoses linked to this encounter.    Additional instructions  Subjective:  Patient presents with venous ulcer of the Bilateral lower extremity.    Procedure:  3 layer unna wrap was placed Bilateral lower extremity.   Plan:   Follow up in one week.

## 2024-02-22 ENCOUNTER — Encounter (INDEPENDENT_AMBULATORY_CARE_PROVIDER_SITE_OTHER): Payer: Self-pay | Admitting: Nurse Practitioner

## 2024-02-26 ENCOUNTER — Encounter (INDEPENDENT_AMBULATORY_CARE_PROVIDER_SITE_OTHER)

## 2024-03-01 ENCOUNTER — Encounter (INDEPENDENT_AMBULATORY_CARE_PROVIDER_SITE_OTHER): Payer: Self-pay | Admitting: Nurse Practitioner

## 2024-03-01 ENCOUNTER — Ambulatory Visit (INDEPENDENT_AMBULATORY_CARE_PROVIDER_SITE_OTHER): Admitting: Nurse Practitioner

## 2024-03-01 VITALS — BP 151/87 | HR 92 | Resp 18 | Wt 387.4 lb

## 2024-03-01 DIAGNOSIS — I890 Lymphedema, not elsewhere classified: Secondary | ICD-10-CM | POA: Diagnosis not present

## 2024-03-01 NOTE — Progress Notes (Signed)
 History of Present Illness  There is no documented history at this time  Assessments & Plan   There are no diagnoses linked to this encounter.    Additional instructions  Subjective:  Patient presents with venous ulcer of the Bilateral lower extremity.    Procedure:  3 layer unna wrap was placed Bilateral lower extremity.   Plan:   Follow up in one week.

## 2024-03-04 ENCOUNTER — Encounter (INDEPENDENT_AMBULATORY_CARE_PROVIDER_SITE_OTHER)

## 2024-03-11 ENCOUNTER — Ambulatory Visit (INDEPENDENT_AMBULATORY_CARE_PROVIDER_SITE_OTHER): Admitting: Nurse Practitioner

## 2024-03-12 ENCOUNTER — Encounter (INDEPENDENT_AMBULATORY_CARE_PROVIDER_SITE_OTHER): Payer: Self-pay | Admitting: Nurse Practitioner

## 2024-03-12 ENCOUNTER — Ambulatory Visit (INDEPENDENT_AMBULATORY_CARE_PROVIDER_SITE_OTHER): Admitting: Nurse Practitioner

## 2024-03-12 VITALS — BP 145/95 | HR 93 | Resp 20 | Ht 71.0 in | Wt 389.0 lb

## 2024-03-12 DIAGNOSIS — I890 Lymphedema, not elsewhere classified: Secondary | ICD-10-CM | POA: Diagnosis not present

## 2024-03-12 DIAGNOSIS — Z6841 Body Mass Index (BMI) 40.0 and over, adult: Secondary | ICD-10-CM | POA: Diagnosis not present

## 2024-03-12 DIAGNOSIS — E66813 Obesity, class 3: Secondary | ICD-10-CM | POA: Diagnosis not present

## 2024-03-13 NOTE — Progress Notes (Unsigned)
 Subjective:    Patient ID: Joanna Nielsen, female    DOB: [birth year], 63 y.o.   MRN: [id number] Chief Complaint  Patient presents with  . Follow-up    fu unna    HPI  Review of Systems     Objective:    Physical Exam  BP (!) 145/95   Pulse 93   Resp 20   Ht 5\' 11"  (1.803 m)   Wt (!) 389 lb (176.4 kg)   BMI 54.25 kg/m   Past Medical History:  Diagnosis Date  . Irregular heartbeat     Social History   Socioeconomic History  . Marital status: Married    Spouse name: Not on file  . Number of children: Not on file  . Years of education: Not on file  . Highest education level: Not on file  Occupational History  . Not on file  Tobacco Use  . Smoking status: Never  . Smokeless tobacco: Never  Substance and Sexual Activity  . Alcohol use: No  . Drug use: Never  . Sexual activity: Not on file  Other Topics Concern  . Not on file  Social History Narrative  . Not on file   Social Drivers of Health   Financial Resource Strain: Low Risk  (08/19/2023)   Received from [facility]   Overall Financial Resource Strain (CARDIA)   . Difficulty of Paying Living Expenses: Not hard at all  Food Insecurity: No Food Insecurity (08/19/2023)   Received from [facility]   Hunger Vital Sign   . Worried About Programme researcher, broadcasting/film/video in the Last Year: Never true   . Ran Out of Food in the Last Year: Never true  Transportation Needs: No Transportation Needs (08/19/2023)   Received from [facility]   [facility] - Transportation   . In the past 12 months, has lack of transportation kept you from medical appointments or from getting medications?: No   . Lack of Transportation (Non-Medical): No  Physical Activity: Not on file  Stress: Not on file  Social Connections: Not on file  Intimate Partner Violence: Not on file    Past Surgical History:  Procedure Laterality Date  . ABDOMINAL HYSTERECTOMY    . FRACTURE SURGERY       Family History  Problem Relation Age of Onset  . Pancreatic cancer Father   . Diabetes Sister   . Anxiety disorder Brother   . Diabetes Maternal Grandfather     No Known Allergies      No data to display             CMP  No results found for: "NA", "K", "CL", "CO2", "GLUCOSE", "BUN", "CREATININE", "CALCIUM", "PROT", "ALBUMIN", "AST", "ALT", "ALKPHOS", "BILITOT", "GFR", "EGFR", "GFRNONAA"   No results found.     Assessment & Plan:   1. Lymphedema (Primary) ***  2. Class 3 severe obesity with body mass index (BMI) of 50.0 to 59.9 in adult, unspecified obesity type, unspecified whether serious comorbidity present ***   Current Outpatient Medications on File Prior to Visit  Medication Sig Dispense Refill  . ALPRAZolam (XANAX) 0.5 MG tablet Take 0.5 mg by mouth 2 (two) times daily as needed. (Patient not taking: Reported on 01/16/2022)    . amoxicillin-clavulanate (AUGMENTIN) 875-125 MG tablet Take 1 tablet by mouth 2 (two) times daily. (Patient not taking: Reported on 09/23/2023)    . aspirin-acetaminophen -caffeine (EXCEDRIN MIGRAINE) 250-250-65 MG tablet Take by mouth every 6 (six)  hours as needed for headache. (Patient not taking: Reported on 04/04/2022)    . ciprofloxacin (CIPRO) 250 MG tablet Take 250 mg by mouth 2 (two) times daily. (Patient not taking: Reported on 05/22/2022)    . escitalopram (LEXAPRO) 10 MG tablet Take 10 mg by mouth daily. (Patient not taking: Reported on 01/16/2022)    . furosemide (LASIX) 20 MG tablet Take 20 mg by mouth as needed. (Patient not taking: Reported on 02/06/2024)    . HYDROcodone-acetaminophen  (NORCO/VICODIN) 5-325 MG tablet 1 tablet every 6 (six) hours as needed. (Patient not taking: Reported on 01/29/2024)    . ibuprofen (ADVIL) 200 MG tablet Take 200 mg by mouth every 6 (six) hours as needed. (Patient not taking: Reported on 09/23/2023)    . montelukast (SINGULAIR) 10 MG tablet Take 10 mg by mouth daily. (Patient not taking: Reported  on 09/23/2023)    . mupirocin ointment (BACTROBAN) 2 % Apply 1 Application topically 3 (three) times daily. (Patient not taking: Reported on 09/23/2023)    . naproxen (NAPROSYN) 500 MG tablet Take 500 mg by mouth 2 (two) times daily. (Patient not taking: Reported on 02/06/2024)     No current facility-administered medications on file prior to visit.    There are no Patient Instructions on file for this visit. No follow-ups on file.   Estrella Alcaraz E Fermin Yan, NP

## 2024-03-16 ENCOUNTER — Telehealth (INDEPENDENT_AMBULATORY_CARE_PROVIDER_SITE_OTHER): Payer: Self-pay

## 2024-03-16 NOTE — Telephone Encounter (Signed)
 Everlena Hoard from [place] was informed and will contact the patient.

## 2024-03-16 NOTE — Telephone Encounter (Signed)
-----   Message from Valene Gash sent at 03/14/2024 11:57 PM EDT ----- Can we reach out to [facility], she states that her leg sleeves are too tight.  Thanks.

## 2024-03-18 ENCOUNTER — Encounter (INDEPENDENT_AMBULATORY_CARE_PROVIDER_SITE_OTHER): Payer: Self-pay

## 2024-03-18 ENCOUNTER — Ambulatory Visit (INDEPENDENT_AMBULATORY_CARE_PROVIDER_SITE_OTHER): Admitting: Nurse Practitioner

## 2024-03-18 VITALS — BP 157/87 | HR 74 | Resp 16

## 2024-03-18 DIAGNOSIS — I890 Lymphedema, not elsewhere classified: Secondary | ICD-10-CM | POA: Diagnosis not present

## 2024-03-18 NOTE — Progress Notes (Unsigned)
 History of Present Illness  There is no documented history at this time  Assessments & Plan   There are no diagnoses linked to this encounter.    Additional instructions  Subjective:  Patient presents with venous ulcer of the Bilateral lower extremity.    Procedure:  3 layer unna wrap was placed Bilateral lower extremity.   Plan:   Follow up in one week.

## 2024-03-19 ENCOUNTER — Encounter (INDEPENDENT_AMBULATORY_CARE_PROVIDER_SITE_OTHER): Payer: Self-pay | Admitting: Nurse Practitioner

## 2024-03-23 ENCOUNTER — Ambulatory Visit (INDEPENDENT_AMBULATORY_CARE_PROVIDER_SITE_OTHER): Payer: BC Managed Care – PPO | Admitting: Vascular Surgery

## 2024-03-25 ENCOUNTER — Encounter (INDEPENDENT_AMBULATORY_CARE_PROVIDER_SITE_OTHER): Payer: Self-pay

## 2024-03-25 ENCOUNTER — Ambulatory Visit (INDEPENDENT_AMBULATORY_CARE_PROVIDER_SITE_OTHER): Admitting: Nurse Practitioner

## 2024-03-25 VITALS — BP 154/98 | HR 94 | Resp 18 | Ht 71.0 in | Wt 388.6 lb

## 2024-03-25 DIAGNOSIS — I890 Lymphedema, not elsewhere classified: Secondary | ICD-10-CM | POA: Diagnosis not present

## 2024-03-25 NOTE — Progress Notes (Signed)
 History of Present Illness  There is no documented history at this time  Assessments & Plan   There are no diagnoses linked to this encounter.    Additional instructions  Subjective:  Patient presents with venous ulcer of the Bilateral lower extremity.    Procedure:  3 layer unna wrap was placed Bilateral lower extremity.   Plan:   Follow up in one week.

## 2024-03-26 ENCOUNTER — Encounter (INDEPENDENT_AMBULATORY_CARE_PROVIDER_SITE_OTHER): Payer: Self-pay | Admitting: Nurse Practitioner

## 2024-04-01 ENCOUNTER — Ambulatory Visit (INDEPENDENT_AMBULATORY_CARE_PROVIDER_SITE_OTHER): Admitting: Nurse Practitioner

## 2024-04-01 VITALS — BP 143/85 | HR 93

## 2024-04-01 DIAGNOSIS — I890 Lymphedema, not elsewhere classified: Secondary | ICD-10-CM | POA: Diagnosis not present

## 2024-04-01 NOTE — Progress Notes (Signed)
 History of Present Illness  There is no documented history at this time  Assessments & Plan   There are no diagnoses linked to this encounter.    Additional instructions  Subjective:  Patient presents with venous ulcer of the Bilateral lower extremity.    Procedure:  3 layer unna wrap was placed Bilateral lower extremity.   Plan:   Follow up in one week.

## 2024-04-08 ENCOUNTER — Ambulatory Visit (INDEPENDENT_AMBULATORY_CARE_PROVIDER_SITE_OTHER): Admitting: Nurse Practitioner

## 2024-04-08 ENCOUNTER — Encounter (INDEPENDENT_AMBULATORY_CARE_PROVIDER_SITE_OTHER): Payer: Self-pay | Admitting: Nurse Practitioner

## 2024-04-08 VITALS — BP 140/79 | HR 65 | Resp 16

## 2024-04-08 DIAGNOSIS — I890 Lymphedema, not elsewhere classified: Secondary | ICD-10-CM | POA: Diagnosis not present

## 2024-04-08 NOTE — Progress Notes (Signed)
 History of Present Illness  There is no documented history at this time  Assessments & Plan   There are no diagnoses linked to this encounter.    Additional instructions  Subjective:  Patient presents with venous ulcer of the Bilateral lower extremity.    Procedure:  3 layer unna wrap was placed Bilateral lower extremity.   Plan:   Follow up in one week.

## 2024-04-11 ENCOUNTER — Encounter (INDEPENDENT_AMBULATORY_CARE_PROVIDER_SITE_OTHER): Payer: Self-pay | Admitting: Nurse Practitioner

## 2024-04-14 ENCOUNTER — Ambulatory Visit (INDEPENDENT_AMBULATORY_CARE_PROVIDER_SITE_OTHER): Admitting: Nurse Practitioner

## 2024-04-14 ENCOUNTER — Encounter (INDEPENDENT_AMBULATORY_CARE_PROVIDER_SITE_OTHER): Payer: Self-pay | Admitting: Nurse Practitioner

## 2024-04-14 VITALS — BP 135/84 | HR 82 | Resp 18 | Wt 384.2 lb

## 2024-04-14 DIAGNOSIS — J4520 Mild intermittent asthma, uncomplicated: Secondary | ICD-10-CM | POA: Diagnosis not present

## 2024-04-14 DIAGNOSIS — I890 Lymphedema, not elsewhere classified: Secondary | ICD-10-CM

## 2024-04-14 NOTE — Progress Notes (Signed)
 Subjective:    Patient ID: Joanna Nielsen, female    DOB: 02-25-1961, 63 y.o.   MRN: [id number] Chief Complaint  Patient presents with   Follow-up    Unna wrap follow up    The patient returns today for follow-up after bilateral Unna boots.  She is doing well after several weeks.  She notes that after the Unna boot her legs feel much improved.  She still continues to have some swelling and some wounds that are completely healed.  She notes that she is lost some fluid and her lymphedema pump now fits as she is able to currently use it.  She notes that her legs are feeling much better after last several weeks in Unna boots.    Review of Systems  Cardiovascular:  Positive for leg swelling.  Skin:  Negative for wound.  All other systems reviewed and are negative.      Objective:   Physical Exam Vitals reviewed.  HENT:     Head: Normocephalic.  Cardiovascular:     Rate and Rhythm: Normal rate.  Pulmonary:     Effort: Pulmonary effort is normal.  Musculoskeletal:     Right lower leg: 3+ Edema present.     Left lower leg: 3+ Edema present.  Skin:    General: Skin is warm and dry.  Neurological:     Mental Status: She is alert and oriented to person, place, and time.  Psychiatric:        Mood and Affect: Mood normal.        Behavior: Behavior normal.        Thought Content: Thought content normal.        Judgment: Judgment normal.     BP 135/84   Pulse 82   Resp 18   Wt (!) 384 lb 3.2 oz (174.3 kg)   BMI 53.59 kg/m   Past Medical History:  Diagnosis Date   Irregular heartbeat     Social History   Socioeconomic History   Marital status: Married    Spouse name: Not on file   Number of children: Not on file   Years of education: Not on file   Highest education level: Not on file  Occupational History   Not on file  Tobacco Use   Smoking status: Never   Smokeless tobacco: Never  Substance and Sexual Activity   Alcohol use: No   Drug use: Never   Sexual  activity: Not on file  Other Topics Concern   Not on file  Social History Narrative   Not on file   Social Drivers of Health   Financial Resource Strain: Low Risk  (08/19/2023)   Received from [facility]   Overall Financial Resource Strain (CARDIA)    Difficulty of Paying Living Expenses: Not hard at all  Food Insecurity: No Food Insecurity (08/19/2023)   Received from [facility]   Hunger Vital Sign    Within the past 12 months, you worried that your food would run out before you got the money to buy more.: Never true    Within the past 12 months, the food you bought just didn't last and you didn't have money to get more.: Never true  Transportation Needs: No Transportation Needs (08/19/2023)   Received from [facility] - Transportation    In the past 12 months, has lack of transportation kept you from medical appointments or from getting medications?: No  Lack of Transportation (Non-Medical): No  Physical Activity: Not on file  Stress: Not on file  Social Connections: Not on file  Intimate Partner Violence: Not on file    Past Surgical History:  Procedure Laterality Date   ABDOMINAL HYSTERECTOMY     FRACTURE SURGERY      Family History  Problem Relation Age of Onset   Pancreatic cancer Father    Diabetes Sister    Anxiety disorder Brother    Diabetes Maternal Grandfather     No Known Allergies      No data to display            CMP  No results found for: NA, K, CL, CO2, GLUCOSE, BUN, CREATININE, CALCIUM, PROT, ALBUMIN, AST, ALT, ALKPHOS, BILITOT, GFR, EGFR, GFRNONAA   No results found.     Assessment & Plan:   1. Lymphedema (Primary) The patient's swelling is much improved from the previous visits.  Will remove from her Unna boots at this time.  She is advised to continue with conservative therapies including use of medical grade compression socks  elevation and activity as well.  Will plan to have the patient return in 6 weeks to evaluate her progress with her conservative therapies.  In addition she does have a lymphedema pump that is now fitting properly and so she should also be advised to use that twice a day as well.  2. Asthma, stable, mild intermittent Continue pulmonary medications and aerosols as already ordered, these medications have been reviewed and there are no changes at this time.    Current Outpatient Medications on File Prior to Visit  Medication Sig Dispense Refill   ALPRAZolam (XANAX) 0.5 MG tablet Take 0.5 mg by mouth 2 (two) times daily as needed.     amoxicillin-clavulanate (AUGMENTIN) 875-125 MG tablet Take 1 tablet by mouth 2 (two) times daily.     aspirin-acetaminophen -caffeine (EXCEDRIN MIGRAINE) 250-250-65 MG tablet Take by mouth every 6 (six) hours as needed for headache.     ciprofloxacin (CIPRO) 250 MG tablet Take 250 mg by mouth 2 (two) times daily.     escitalopram (LEXAPRO) 10 MG tablet Take 10 mg by mouth daily.     furosemide (LASIX) 20 MG tablet Take 20 mg by mouth as needed.     HYDROcodone-acetaminophen  (NORCO/VICODIN) 5-325 MG tablet 1 tablet every 6 (six) hours as needed.     ibuprofen (ADVIL) 200 MG tablet Take 200 mg by mouth every 6 (six) hours as needed.     montelukast (SINGULAIR) 10 MG tablet Take 10 mg by mouth daily.     mupirocin ointment (BACTROBAN) 2 % Apply 1 Application topically 3 (three) times daily.     naproxen (NAPROSYN) 500 MG tablet Take 500 mg by mouth 2 (two) times daily.     No current facility-administered medications on file prior to visit.    There are no Patient Instructions on file for this visit. No follow-ups on file.   Jafari Mckillop E Cornisha Zetino, NP

## 2024-04-20 DIAGNOSIS — R946 Abnormal results of thyroid function studies: Secondary | ICD-10-CM | POA: Diagnosis not present

## 2024-04-20 DIAGNOSIS — I890 Lymphedema, not elsewhere classified: Secondary | ICD-10-CM | POA: Diagnosis not present

## 2024-04-20 DIAGNOSIS — Z13220 Encounter for screening for lipoid disorders: Secondary | ICD-10-CM | POA: Diagnosis not present

## 2024-04-20 DIAGNOSIS — E66813 Obesity, class 3: Secondary | ICD-10-CM | POA: Diagnosis not present

## 2024-04-20 DIAGNOSIS — R7303 Prediabetes: Secondary | ICD-10-CM | POA: Diagnosis not present

## 2024-04-26 ENCOUNTER — Encounter (INDEPENDENT_AMBULATORY_CARE_PROVIDER_SITE_OTHER): Payer: Self-pay | Admitting: Nurse Practitioner

## 2024-05-26 ENCOUNTER — Ambulatory Visit (INDEPENDENT_AMBULATORY_CARE_PROVIDER_SITE_OTHER): Admitting: Nurse Practitioner

## 2024-05-27 ENCOUNTER — Encounter (INDEPENDENT_AMBULATORY_CARE_PROVIDER_SITE_OTHER): Payer: Self-pay | Admitting: Nurse Practitioner

## 2024-05-27 ENCOUNTER — Ambulatory Visit (INDEPENDENT_AMBULATORY_CARE_PROVIDER_SITE_OTHER): Admitting: Nurse Practitioner

## 2024-05-27 VITALS — BP 138/77 | HR 67 | Ht 71.0 in | Wt 384.4 lb

## 2024-05-27 DIAGNOSIS — Z6841 Body Mass Index (BMI) 40.0 and over, adult: Secondary | ICD-10-CM

## 2024-05-27 DIAGNOSIS — E66813 Obesity, class 3: Secondary | ICD-10-CM

## 2024-05-27 DIAGNOSIS — I890 Lymphedema, not elsewhere classified: Secondary | ICD-10-CM | POA: Diagnosis not present

## 2024-05-30 NOTE — Progress Notes (Signed)
 Subjective:    Patient ID: Joanna Nielsen, female    DOB: 08/10/1961, 63 y.o.   MRN: [id number] Chief Complaint  Patient presents with   Leg Swelling    Notice a couple that spot on the back of her left leg, she said it drainage since has stop    The patient returns today for follow-up after bilateral Unna boots.  She is doing well after several weeks.  She notes that she has developed a small wound.  She has been able to wear her compression pumps and they have been helpful but despite this she has developed an additional small ulceration.    Review of Systems  Cardiovascular:  Positive for leg swelling.  Musculoskeletal:  Positive for arthralgias.  All other systems reviewed and are negative.      Objective:    Physical Exam Vitals reviewed.  Constitutional:      Appearance: She is obese.  HENT:     Head: Normocephalic.  Cardiovascular:     Rate and Rhythm: Normal rate.  Pulmonary:     Effort: Pulmonary effort is normal.  Musculoskeletal:     Right lower leg: Edema present.     Left lower leg: Edema present.  Skin:    General: Skin is warm and dry.  Neurological:     Mental Status: She is oriented to person, place, and time.     Motor: Weakness present.     Gait: Gait abnormal.  Psychiatric:        Mood and Affect: Mood normal.        Behavior: Behavior normal.        Thought Content: Thought content normal.        Judgment: Judgment normal.     BP 138/77   Pulse 67   Ht 5' 11 (1.803 m)   Wt (!) 384 lb 6.4 oz (174.4 kg)   BMI 53.61 kg/m   Past Medical History:  Diagnosis Date   Irregular heartbeat     Social History   Socioeconomic History   Marital status: Married    Spouse name: Not on file   Number of children: Not on file   Years of education: Not on file   Highest education level: Not on file  Occupational History   Not on file  Tobacco Use   Smoking status: Never   Smokeless tobacco: Never  Substance and Sexual Activity   Alcohol  use: No   Drug use: Never   Sexual activity: Not on file  Other Topics Concern   Not on file  Social History Narrative   Not on file   Social Drivers of Health   Financial Resource Strain: Low Risk  (08/19/2023)   Received from [facility]   Overall Financial Resource Strain (CARDIA)    Difficulty of Paying Living Expenses: Not hard at all  Food Insecurity: No Food Insecurity (08/19/2023)   Received from [facility]   Hunger Vital Sign    Within the past 12 months, you worried that your food would run out before you got the money to buy more.: Never true    Within the past 12 months, the food you bought just didn't last and you didn't have money to get more.: Never true  Transportation Needs: No Transportation Needs (08/19/2023)   Received from [facility] - Transportation    In the past 12 months, has lack of transportation kept you from medical  appointments or from getting medications?: No    Lack of Transportation (Non-Medical): No  Physical Activity: Not on file  Stress: Not on file  Social Connections: Not on file  Intimate Partner Violence: Not on file    Past Surgical History:  Procedure Laterality Date   ABDOMINAL HYSTERECTOMY     FRACTURE SURGERY      Family History  Problem Relation Age of Onset   Pancreatic cancer Father    Diabetes Sister    Anxiety disorder Brother    Diabetes Maternal Grandfather     No Known Allergies      No data to display             CMP  No results found for: NA, K, CL, CO2, GLUCOSE, BUN, CREATININE, CALCIUM, PROT, ALBUMIN, AST, ALT, ALKPHOS, BILITOT, GFR, EGFR, GFRNONAA   No results found.     Assessment & Plan:   1. Lymphedema (Primary) No surgery or intervention at this point in time.    I have had a long discussion with the patient regarding venous insufficiency and why it  causes symptoms, specifically venous  ulceration. I have discussed with the patient the chronic skin changes that accompany venous insufficiency and the long term sequela such as infection and recurring  ulceration.  Patient will be placed in [employer] which will be changed weekly drainage permitting.  In addition, behavioral modification including several periods of elevation of the lower extremities during the day will be continued. Achieving a position with the ankles at heart level was stressed to the patient  The patient is instructed to begin routine exercise, especially walking on a daily basis  In the future the patient can be assessed for graduated compression stockings or wraps as well as a Lymph Pump once the ulcers are healed.  2. Class 3 severe obesity with body mass index (BMI) of 50.0 to 59.9 in adult, unspecified obesity type, unspecified whether serious comorbidity present Weight loss would help with her lymphedema   Current Outpatient Medications on File Prior to Visit  Medication Sig Dispense Refill   ALPRAZolam (XANAX) 0.5 MG tablet Take 0.5 mg by mouth 2 (two) times daily as needed.     aspirin-acetaminophen -caffeine (EXCEDRIN MIGRAINE) 250-250-65 MG tablet Take by mouth every 6 (six) hours as needed for headache.     escitalopram (LEXAPRO) 10 MG tablet Take 10 mg by mouth daily.     furosemide (LASIX) 20 MG tablet Take 20 mg by mouth as needed.     montelukast (SINGULAIR) 10 MG tablet Take 10 mg by mouth daily.     amoxicillin-clavulanate (AUGMENTIN) 875-125 MG tablet Take 1 tablet by mouth 2 (two) times daily. (Patient not taking: Reported on 05/27/2024)     ciprofloxacin (CIPRO) 250 MG tablet Take 250 mg by mouth 2 (two) times daily. (Patient not taking: Reported on 05/27/2024)     HYDROcodone-acetaminophen  (NORCO/VICODIN) 5-325 MG tablet 1 tablet every 6 (six) hours as needed. (Patient not taking: Reported on 05/27/2024)     ibuprofen (ADVIL) 200 MG tablet Take 200 mg by mouth every 6 (six) hours as needed.  (Patient not taking: Reported on 05/27/2024)     mupirocin ointment (BACTROBAN) 2 % Apply 1 Application topically 3 (three) times daily.     naproxen (NAPROSYN) 500 MG tablet Take 500 mg by mouth 2 (two) times daily. (Patient not taking: Reported on 05/27/2024)     No current facility-administered medications on file prior to visit.    There are  no Patient Instructions on file for this visit. No follow-ups on file.   Raeshawn Tafolla E Sacheen Arrasmith, NP

## 2024-05-31 ENCOUNTER — Encounter (INDEPENDENT_AMBULATORY_CARE_PROVIDER_SITE_OTHER): Payer: Self-pay | Admitting: Nurse Practitioner

## 2024-06-03 ENCOUNTER — Encounter (INDEPENDENT_AMBULATORY_CARE_PROVIDER_SITE_OTHER): Payer: Self-pay | Admitting: Nurse Practitioner

## 2024-06-03 ENCOUNTER — Ambulatory Visit (INDEPENDENT_AMBULATORY_CARE_PROVIDER_SITE_OTHER): Admitting: Nurse Practitioner

## 2024-06-03 VITALS — BP 146/89 | HR 79 | Resp 18

## 2024-06-03 DIAGNOSIS — I890 Lymphedema, not elsewhere classified: Secondary | ICD-10-CM

## 2024-06-03 NOTE — Progress Notes (Signed)
 History of Present Illness  There is no documented history at this time  Assessments & Plan   There are no diagnoses linked to this encounter.    Additional instructions  Subjective:  Patient presents with venous ulcer of the Bilateral lower extremity.    Procedure:  3 layer unna wrap was placed Bilateral lower extremity.   Plan:   Follow up in one week.

## 2024-06-06 ENCOUNTER — Encounter (INDEPENDENT_AMBULATORY_CARE_PROVIDER_SITE_OTHER): Payer: Self-pay | Admitting: Nurse Practitioner

## 2024-06-10 ENCOUNTER — Ambulatory Visit (INDEPENDENT_AMBULATORY_CARE_PROVIDER_SITE_OTHER): Admitting: Nurse Practitioner

## 2024-06-10 ENCOUNTER — Encounter (INDEPENDENT_AMBULATORY_CARE_PROVIDER_SITE_OTHER): Payer: Self-pay | Admitting: Nurse Practitioner

## 2024-06-10 VITALS — BP 138/74 | HR 77 | Resp 18 | Wt 376.4 lb

## 2024-06-10 DIAGNOSIS — I890 Lymphedema, not elsewhere classified: Secondary | ICD-10-CM | POA: Diagnosis not present

## 2024-06-10 NOTE — Progress Notes (Signed)
 History of Present Illness  There is no documented history at this time  Assessments & Plan   There are no diagnoses linked to this encounter.    Additional instructions  Subjective:  Patient presents with venous ulcer of the Bilateral lower extremity.    Procedure:  3 layer unna wrap was placed Bilateral lower extremity.   Plan:   Follow up in one week.

## 2024-06-14 ENCOUNTER — Encounter (INDEPENDENT_AMBULATORY_CARE_PROVIDER_SITE_OTHER): Payer: Self-pay | Admitting: Nurse Practitioner

## 2024-06-17 ENCOUNTER — Encounter (INDEPENDENT_AMBULATORY_CARE_PROVIDER_SITE_OTHER): Payer: Self-pay | Admitting: Nurse Practitioner

## 2024-06-17 ENCOUNTER — Ambulatory Visit (INDEPENDENT_AMBULATORY_CARE_PROVIDER_SITE_OTHER): Admitting: Nurse Practitioner

## 2024-06-17 VITALS — BP 150/89 | HR 80 | Resp 18

## 2024-06-17 DIAGNOSIS — I890 Lymphedema, not elsewhere classified: Secondary | ICD-10-CM | POA: Diagnosis not present

## 2024-06-17 NOTE — Progress Notes (Signed)
 History of Present Illness  There is no documented history at this time  Assessments & Plan   There are no diagnoses linked to this encounter.    Additional instructions  Subjective:  Patient presents with venous ulcer of the Bilateral lower extremity.    Procedure:  3 layer unna wrap was placed Bilateral lower extremity.   Plan:   Follow up in one week.

## 2024-06-20 ENCOUNTER — Encounter (INDEPENDENT_AMBULATORY_CARE_PROVIDER_SITE_OTHER): Payer: Self-pay | Admitting: Nurse Practitioner

## 2024-06-24 ENCOUNTER — Encounter (INDEPENDENT_AMBULATORY_CARE_PROVIDER_SITE_OTHER): Payer: Self-pay

## 2024-06-24 ENCOUNTER — Ambulatory Visit (INDEPENDENT_AMBULATORY_CARE_PROVIDER_SITE_OTHER): Admitting: Nurse Practitioner

## 2024-06-24 VITALS — BP 145/89 | HR 86 | Resp 18

## 2024-06-24 DIAGNOSIS — I890 Lymphedema, not elsewhere classified: Secondary | ICD-10-CM | POA: Diagnosis not present

## 2024-06-24 NOTE — Progress Notes (Unsigned)
 History of Present Illness  There is no documented history at this time  Assessments & Plan   There are no diagnoses linked to this encounter.    Additional instructions  Subjective:  Patient presents with venous ulcer of the Bilateral lower extremity.    Procedure:  3 layer unna wrap was placed Bilateral lower extremity.   Plan:   Follow up in one week.

## 2024-06-27 ENCOUNTER — Encounter (INDEPENDENT_AMBULATORY_CARE_PROVIDER_SITE_OTHER): Payer: Self-pay | Admitting: Nurse Practitioner

## 2024-07-01 ENCOUNTER — Ambulatory Visit (INDEPENDENT_AMBULATORY_CARE_PROVIDER_SITE_OTHER): Admitting: Nurse Practitioner

## 2024-07-01 ENCOUNTER — Encounter (INDEPENDENT_AMBULATORY_CARE_PROVIDER_SITE_OTHER): Payer: Self-pay | Admitting: Nurse Practitioner

## 2024-07-01 VITALS — BP 130/89 | HR 111 | Ht 71.0 in | Wt 377.0 lb

## 2024-07-01 DIAGNOSIS — I890 Lymphedema, not elsewhere classified: Secondary | ICD-10-CM

## 2024-07-01 DIAGNOSIS — E66813 Obesity, class 3: Secondary | ICD-10-CM

## 2024-07-01 DIAGNOSIS — Z6841 Body Mass Index (BMI) 40.0 and over, adult: Secondary | ICD-10-CM | POA: Diagnosis not present

## 2024-07-05 ENCOUNTER — Encounter (INDEPENDENT_AMBULATORY_CARE_PROVIDER_SITE_OTHER): Payer: Self-pay | Admitting: Nurse Practitioner

## 2024-07-05 NOTE — Progress Notes (Addendum)
 Subjective:    Patient ID: Joanna Nielsen, female    DOB: 03-19-1961, 63 y.o.   MRN: [id number] Chief Complaint  Patient presents with   Follow-up    fu unna see FB     The patient returns today for follow-up after bilateral Unna boots.  She is doing well after several weeks.  The previous wound she ha, has healed.  She has been able to wear her compression pumps and they have been helpful but despite this she has developed an additional small ulceration.    Review of Systems  Cardiovascular:  Positive for leg swelling.  Musculoskeletal:  Positive for arthralgias.  All other systems reviewed and are negative.      Objective:    Physical Exam Vitals reviewed.  Constitutional:      Appearance: She is obese.  HENT:     Head: Normocephalic.  Cardiovascular:     Rate and Rhythm: Normal rate.  Pulmonary:     Effort: Pulmonary effort is normal.  Musculoskeletal:     Right lower leg: Edema present.     Left lower leg: Edema present.  Skin:    General: Skin is warm and dry.  Neurological:     Mental Status: She is oriented to person, place, and time.     Motor: Weakness present.     Gait: Gait abnormal.  Psychiatric:        Mood and Affect: Mood normal.        Behavior: Behavior normal.        Thought Content: Thought content normal.        Judgment: Judgment normal.     BP 130/89   Pulse (!) 111   Ht 5' 11 (1.803 m)   Wt (!) 377 lb (171 kg)   BMI 52.58 kg/m   Past Medical History:  Diagnosis Date   Irregular heartbeat     Social History   Socioeconomic History   Marital status: Married    Spouse name: Not on file   Number of children: Not on file   Years of education: Not on file   Highest education level: Not on file  Occupational History   Not on file  Tobacco Use   Smoking status: Never   Smokeless tobacco: Never  Substance and Sexual Activity   Alcohol use: No   Drug use: Never   Sexual activity: Not on file  Other Topics Concern   Not  on file  Social History Narrative   Not on file   Social Drivers of Health   Financial Resource Strain: Low Risk  (08/19/2023)   Received from [facility]   Overall Financial Resource Strain (CARDIA)    Difficulty of Paying Living Expenses: Not hard at all  Food Insecurity: No Food Insecurity (08/19/2023)   Received from [facility]   Hunger Vital Sign    Within the past 12 months, you worried that your food would run out before you got the money to buy more.: Never true    Within the past 12 months, the food you bought just didn't last and you didn't have money to get more.: Never true  Transportation Needs: No Transportation Needs (08/19/2023)   Received from [facility] - Transportation    In the past 12 months, has lack of transportation kept you from medical appointments or from getting medications?: No    Lack of Transportation (Non-Medical): No  Physical Activity: Not  on file  Stress: Not on file  Social Connections: Not on file  Intimate Partner Violence: Not on file    Past Surgical History:  Procedure Laterality Date   ABDOMINAL HYSTERECTOMY     FRACTURE SURGERY      Family History  Problem Relation Age of Onset   Pancreatic cancer Father    Diabetes Sister    Anxiety disorder Brother    Diabetes Maternal Grandfather     No Known Allergies      No data to display             CMP  No results found for: NA, K, CL, CO2, GLUCOSE, BUN, CREATININE, CALCIUM, PROT, ALBUMIN, AST, ALT, ALKPHOS, BILITOT, GFR, EGFR, GFRNONAA   No results found.     Assessment & Plan:   1. Lymphedema (Primary) Currently patient is doing well so she will come out of Unna boots and transition back to utilizing medical grade compression in addition to her lymphedema pump.  She should also continue with elevation and activity as possible.  2. Class 3 severe obesity with body mass  index (BMI) of 50.0 to 59.9 in adult, unspecified obesity type, unspecified whether serious comorbidity present Weight loss would help with her lymphedema   Current Outpatient Medications on File Prior to Visit  Medication Sig Dispense Refill   ALPRAZolam (XANAX) 0.5 MG tablet Take 0.5 mg by mouth 2 (two) times daily as needed.     amoxicillin-clavulanate (AUGMENTIN) 875-125 MG tablet Take 1 tablet by mouth 2 (two) times daily.     aspirin-acetaminophen -caffeine (EXCEDRIN MIGRAINE) 250-250-65 MG tablet Take by mouth every 6 (six) hours as needed for headache.     ciprofloxacin (CIPRO) 250 MG tablet Take 250 mg by mouth 2 (two) times daily.     furosemide (LASIX) 20 MG tablet Take 20 mg by mouth as needed.     montelukast (SINGULAIR) 10 MG tablet Take 10 mg by mouth daily.     escitalopram (LEXAPRO) 10 MG tablet Take 10 mg by mouth daily. (Patient not taking: Reported on 07/01/2024)     HYDROcodone-acetaminophen  (NORCO/VICODIN) 5-325 MG tablet 1 tablet every 6 (six) hours as needed. (Patient not taking: Reported on 07/01/2024)     ibuprofen (ADVIL) 200 MG tablet Take 200 mg by mouth every 6 (six) hours as needed. (Patient not taking: Reported on 07/01/2024)     mupirocin ointment (BACTROBAN) 2 % Apply 1 Application topically 3 (three) times daily. (Patient not taking: Reported on 07/01/2024)     naproxen (NAPROSYN) 500 MG tablet Take 500 mg by mouth 2 (two) times daily. (Patient not taking: Reported on 07/01/2024)     No current facility-administered medications on file prior to visit.    There are no Patient Instructions on file for this visit. No follow-ups on file.   Juliany Daughety E Queenie Aufiero, NP

## 2024-07-16 DIAGNOSIS — B9689 Other specified bacterial agents as the cause of diseases classified elsewhere: Secondary | ICD-10-CM | POA: Diagnosis not present

## 2024-07-16 DIAGNOSIS — J209 Acute bronchitis, unspecified: Secondary | ICD-10-CM | POA: Diagnosis not present

## 2024-07-16 DIAGNOSIS — J019 Acute sinusitis, unspecified: Secondary | ICD-10-CM | POA: Diagnosis not present

## 2024-08-19 ENCOUNTER — Ambulatory Visit (INDEPENDENT_AMBULATORY_CARE_PROVIDER_SITE_OTHER): Admitting: Nurse Practitioner

## 2024-11-01 ENCOUNTER — Other Ambulatory Visit: Payer: Self-pay | Admitting: Physician Assistant

## 2024-11-01 DIAGNOSIS — Z1231 Encounter for screening mammogram for malignant neoplasm of breast: Secondary | ICD-10-CM

## 2024-11-25 ENCOUNTER — Encounter
